# Patient Record
Sex: Female | Born: 1990 | Race: Asian | Hispanic: No | Marital: Married | Smoking: Never smoker
Health system: Southern US, Community
[De-identification: ages and names within clinical notes are randomized; demographics above are authoritative.]

## PROBLEM LIST (undated history)

## (undated) DIAGNOSIS — O0930 Supervision of pregnancy with insufficient antenatal care, unspecified trimester: Secondary | ICD-10-CM

## (undated) HISTORY — DX: Supervision of pregnancy with insufficient antenatal care, unspecified trimester: O09.30

## (undated) HISTORY — PX: NO PAST SURGERIES: SHX2092

---

## 2013-03-01 ENCOUNTER — Other Ambulatory Visit (HOSPITAL_COMMUNITY): Payer: Self-pay | Admitting: Physician Assistant

## 2013-03-01 DIAGNOSIS — Z0489 Encounter for examination and observation for other specified reasons: Secondary | ICD-10-CM

## 2013-03-01 LAB — OB RESULTS CONSOLE ANTIBODY SCREEN: Antibody Screen: POSITIVE

## 2013-03-01 LAB — OB RESULTS CONSOLE HEPATITIS B SURFACE ANTIGEN: Hepatitis B Surface Ag: NEGATIVE

## 2013-03-01 LAB — OB RESULTS CONSOLE RPR: RPR: NONREACTIVE

## 2013-03-01 LAB — OB RESULTS CONSOLE ABO/RH: RH Type: POSITIVE

## 2013-03-03 ENCOUNTER — Ambulatory Visit (HOSPITAL_COMMUNITY)
Admission: RE | Admit: 2013-03-03 | Discharge: 2013-03-03 | Disposition: A | Discharge status: Home / Self Care | Payer: Medicaid Other | Source: Ambulatory Visit | Attending: Physician Assistant | Admitting: Physician Assistant

## 2013-03-03 DIAGNOSIS — Z363 Encounter for antenatal screening for malformations: Secondary | ICD-10-CM | POA: Insufficient documentation

## 2013-03-03 DIAGNOSIS — Z0489 Encounter for examination and observation for other specified reasons: Secondary | ICD-10-CM

## 2013-03-03 DIAGNOSIS — O358XX0 Maternal care for other (suspected) fetal abnormality and damage, not applicable or unspecified: Secondary | ICD-10-CM | POA: Insufficient documentation

## 2013-03-03 DIAGNOSIS — Z1389 Encounter for screening for other disorder: Secondary | ICD-10-CM | POA: Insufficient documentation

## 2013-03-29 ENCOUNTER — Other Ambulatory Visit: Payer: Self-pay | Admitting: Nurse Practitioner

## 2013-03-29 DIAGNOSIS — O4412 Placenta previa with hemorrhage, second trimester: Secondary | ICD-10-CM

## 2013-03-29 DIAGNOSIS — IMO0002 Reserved for concepts with insufficient information to code with codable children: Secondary | ICD-10-CM

## 2013-04-26 ENCOUNTER — Ambulatory Visit (HOSPITAL_COMMUNITY)
Admission: RE | Admit: 2013-04-26 | Discharge: 2013-04-26 | Disposition: A | Discharge status: Home / Self Care | Payer: Medicaid Other | Source: Ambulatory Visit | Attending: Nurse Practitioner | Admitting: Nurse Practitioner

## 2013-04-26 DIAGNOSIS — O4412 Placenta previa with hemorrhage, second trimester: Secondary | ICD-10-CM

## 2013-04-26 DIAGNOSIS — IMO0002 Reserved for concepts with insufficient information to code with codable children: Secondary | ICD-10-CM

## 2013-04-26 DIAGNOSIS — Z3689 Encounter for other specified antenatal screening: Secondary | ICD-10-CM | POA: Insufficient documentation

## 2013-04-26 DIAGNOSIS — O4400 Placenta previa specified as without hemorrhage, unspecified trimester: Secondary | ICD-10-CM | POA: Insufficient documentation

## 2013-04-29 ENCOUNTER — Other Ambulatory Visit (HOSPITAL_COMMUNITY): Payer: Self-pay | Admitting: Physician Assistant

## 2013-04-29 DIAGNOSIS — O4403 Placenta previa specified as without hemorrhage, third trimester: Secondary | ICD-10-CM

## 2013-06-07 ENCOUNTER — Ambulatory Visit (HOSPITAL_COMMUNITY)
Admission: RE | Admit: 2013-06-07 | Discharge: 2013-06-07 | Disposition: A | Discharge status: Home / Self Care | Payer: Medicaid Other | Source: Ambulatory Visit | Attending: Physician Assistant | Admitting: Physician Assistant

## 2013-06-07 ENCOUNTER — Other Ambulatory Visit (HOSPITAL_COMMUNITY): Payer: Self-pay | Admitting: Physician Assistant

## 2013-06-07 ENCOUNTER — Encounter (HOSPITAL_COMMUNITY): Payer: Self-pay

## 2013-06-07 DIAGNOSIS — O4400 Placenta previa specified as without hemorrhage, unspecified trimester: Secondary | ICD-10-CM | POA: Insufficient documentation

## 2013-06-07 DIAGNOSIS — O4403 Placenta previa specified as without hemorrhage, third trimester: Secondary | ICD-10-CM

## 2013-06-07 DIAGNOSIS — O358XX0 Maternal care for other (suspected) fetal abnormality and damage, not applicable or unspecified: Secondary | ICD-10-CM | POA: Insufficient documentation

## 2013-06-23 LAB — OB RESULTS CONSOLE GBS: GBS: NEGATIVE

## 2013-07-19 ENCOUNTER — Other Ambulatory Visit (HOSPITAL_COMMUNITY): Payer: Self-pay | Admitting: Nurse Practitioner

## 2013-07-19 DIAGNOSIS — O480 Post-term pregnancy: Secondary | ICD-10-CM

## 2013-07-21 ENCOUNTER — Encounter (HOSPITAL_COMMUNITY): Payer: Self-pay | Admitting: *Deleted

## 2013-07-21 ENCOUNTER — Telehealth (HOSPITAL_COMMUNITY): Payer: Self-pay | Admitting: *Deleted

## 2013-07-21 NOTE — Telephone Encounter (Signed)
Preadmission screen Interpreter number [phone]

## 2013-07-21 NOTE — Telephone Encounter (Signed)
Preadmission screen Interpreter number [phone] states had some bleeding last night. Told to contact MD office today placed a conference call with clinic so pt could speak with them and the interpreter.  See clinic note for phone call conversation.

## 2013-07-21 NOTE — Telephone Encounter (Signed)
Health department notified of conversations with pt and stated if they needed to talk to her they should call [facility] and contact her due to language barrier.  Message left and on call RN paged by health dept

## 2013-07-22 ENCOUNTER — Ambulatory Visit (HOSPITAL_COMMUNITY): Admission: RE | Admit: 2013-07-22 | Payer: Medicaid Other | Source: Ambulatory Visit

## 2013-07-22 ENCOUNTER — Ambulatory Visit (HOSPITAL_COMMUNITY)
Admission: RE | Admit: 2013-07-22 | Discharge: 2013-07-22 | Disposition: A | Discharge status: Home / Self Care | Payer: Medicaid Other | Source: Ambulatory Visit | Attending: Nurse Practitioner | Admitting: Nurse Practitioner

## 2013-07-22 DIAGNOSIS — Z3689 Encounter for other specified antenatal screening: Secondary | ICD-10-CM | POA: Insufficient documentation

## 2013-07-22 DIAGNOSIS — IMO0001 Reserved for inherently not codable concepts without codable children: Secondary | ICD-10-CM | POA: Insufficient documentation

## 2013-07-22 DIAGNOSIS — O480 Post-term pregnancy: Secondary | ICD-10-CM | POA: Insufficient documentation

## 2013-07-27 ENCOUNTER — Encounter (HOSPITAL_COMMUNITY): Payer: Self-pay

## 2013-07-27 ENCOUNTER — Inpatient Hospital Stay (HOSPITAL_COMMUNITY)
Admission: RE | Admit: 2013-07-27 | Discharge: 2013-08-01 | DRG: 765 | Disposition: A | Discharge status: Home / Self Care | Payer: Medicaid Other | Source: Ambulatory Visit | Attending: Obstetrics and Gynecology | Admitting: Obstetrics and Gynecology

## 2013-07-27 DIAGNOSIS — O9903 Anemia complicating the puerperium: Secondary | ICD-10-CM | POA: Diagnosis not present

## 2013-07-27 DIAGNOSIS — IMO0002 Reserved for concepts with insufficient information to code with codable children: Secondary | ICD-10-CM | POA: Diagnosis present

## 2013-07-27 DIAGNOSIS — D649 Anemia, unspecified: Secondary | ICD-10-CM | POA: Diagnosis not present

## 2013-07-27 DIAGNOSIS — O324XX0 Maternal care for high head at term, not applicable or unspecified: Secondary | ICD-10-CM | POA: Diagnosis present

## 2013-07-27 DIAGNOSIS — O480 Post-term pregnancy: Principal | ICD-10-CM | POA: Diagnosis present

## 2013-07-27 LAB — COMPREHENSIVE METABOLIC PANEL
Albumin: 2.6 g/dL — ABNORMAL LOW (ref 3.5–5.2)
Alkaline Phosphatase: 318 U/L — ABNORMAL HIGH (ref 39–117)
BUN: 7 mg/dL (ref 6–23)
CO2: 23 mEq/L (ref 19–32)
Calcium: 9.5 mg/dL (ref 8.4–10.5)
Chloride: 104 mEq/L (ref 96–112)
GFR calc Af Amer: >90 mL/min (ref >90)
GFR calc non Af Amer: >90 mL/min (ref >90)
Glucose, Bld: 107 mg/dL — ABNORMAL HIGH (ref 70–99)
Potassium: 3.4 mEq/L — ABNORMAL LOW (ref 3.5–5.1)
Sodium: 137 mEq/L (ref 135–145)
Total Protein: 6.2 g/dL (ref 6.0–8.3)

## 2013-07-27 LAB — CBC
HCT: 30.9 % — ABNORMAL LOW (ref 36.0–46.0)
Hemoglobin: 10.8 g/dL — ABNORMAL LOW (ref 12.0–15.0)
MCH: 28.3 pg (ref 26.0–34.0)
MCHC: 35 g/dL (ref 30.0–36.0)
Platelets: 149 10*3/uL — ABNORMAL LOW (ref 150–400)
RDW: 14 % (ref 11.5–15.5)
WBC: 8.1 10*3/uL (ref 4.0–10.5)

## 2013-07-27 LAB — PROTEIN / CREATININE RATIO, URINE
Creatinine, Urine: 57.91 mg/dL
Protein Creatinine Ratio: 0.31 — ABNORMAL HIGH (ref 0.00–0.15)

## 2013-07-27 MED ORDER — LIDOCAINE HCL (PF) 1 % IJ SOLN
30.0000 mL | INTRAMUSCULAR | Status: DC | PRN
  Filled 2013-07-27: qty 30

## 2013-07-27 MED ORDER — LACTATED RINGERS IV SOLN
INTRAVENOUS | Status: DC
  Administered 2013-07-27 – 2013-07-29 (×9): via INTRAVENOUS

## 2013-07-27 MED ORDER — CITRIC ACID-SODIUM CITRATE 334-500 MG/5ML PO SOLN
30.0000 mL | ORAL | Status: DC | PRN
  Administered 2013-07-29: 30 mL via ORAL
  Filled 2013-07-27: qty 15

## 2013-07-27 MED ORDER — LACTATED RINGERS IV SOLN: 500.0000 mL | INTRAVENOUS | Status: DC | PRN

## 2013-07-27 MED ORDER — ONDANSETRON HCL 4 MG/2ML IJ SOLN
4.0000 mg | Freq: Four times a day (QID) | INTRAMUSCULAR | Status: DC | PRN
  Filled 2013-07-27: qty 2

## 2013-07-27 MED ORDER — FENTANYL CITRATE 0.05 MG/ML IJ SOLN
100.0000 ug | INTRAMUSCULAR | Status: DC | PRN
  Administered 2013-07-28: 100 ug via INTRAVENOUS
  Filled 2013-07-27: qty 2

## 2013-07-27 MED ORDER — OXYTOCIN BOLUS FROM INFUSION: 500.0000 mL | INTRAVENOUS | Status: DC

## 2013-07-27 MED ORDER — TERBUTALINE SULFATE 1 MG/ML IJ SOLN: 0.2500 mg | Freq: Once | INTRAMUSCULAR | Status: AC | PRN

## 2013-07-27 MED ORDER — OXYCODONE-ACETAMINOPHEN 5-325 MG PO TABS: 1.0000 | ORAL_TABLET | ORAL | Status: DC | PRN

## 2013-07-27 MED ORDER — OXYTOCIN 40 UNITS IN LACTATED RINGERS INFUSION - SIMPLE MED
1.0000 m[IU]/min | INTRAVENOUS | Status: DC
  Administered 2013-07-27: 1 m[IU]/min via INTRAVENOUS
  Administered 2013-07-28: 10 m[IU]/min via INTRAVENOUS
  Administered 2013-07-28: 9 m[IU]/min via INTRAVENOUS

## 2013-07-27 MED ORDER — ACETAMINOPHEN 325 MG PO TABS
650.0000 mg | ORAL_TABLET | ORAL | Status: DC | PRN
  Administered 2013-07-28: 650 mg via ORAL
  Filled 2013-07-27: qty 2

## 2013-07-27 MED ORDER — IBUPROFEN 600 MG PO TABS: 600.0000 mg | ORAL_TABLET | Freq: Four times a day (QID) | ORAL | Status: DC | PRN

## 2013-07-27 MED ORDER — OXYTOCIN 40 UNITS IN LACTATED RINGERS INFUSION - SIMPLE MED
62.5000 mL/h | INTRAVENOUS | Status: DC
  Filled 2013-07-27 (×2): qty 1000

## 2013-07-27 NOTE — Progress Notes (Signed)
Claudia Solis is a 22 y.o. G1P0 at [redacted]w[redacted]d by ultrasound admitted for induction of labor due to Post dates. Due date 07/17/13.  Subjective: Pt comfortable, denies cramping/contracitons.  Support person in room.  Burmese Nurse, learning disability used via Lawyer for all communication.  Objective: BP 138/82  Pulse 57  Temp(Src) 98.4 F (36.9 C) (Oral)  Resp 20  Ht 4\' 11"  (1.499 m)  Wt 70.308 kg (155 lb)  BMI 31.29 kg/m2  LMP 09/15/2012      FHT:  FHR: 130 bpm, variability: moderate,  accelerations:  Present,  decelerations:  Absent UC:   irritability SVE:   Dilation: 3 Effacement (%): 90 Station: -3 Exam by:: Courtney Paris CNM Vertex presentation  Labs: Lab Results  Component Value Date   WBC 8.1 07/27/2013   HGB 10.8* 07/27/2013   HCT 30.9* 07/27/2013   MCV 81.1 07/27/2013   PLT 149* 07/27/2013    Assessment / Plan: Induction of labor due to postterm  Labor: Start Pitocin at this time. Preeclampsia:  N/A, Preeclampsia labs ordered r/t BP 140s/90s on admission Fetal Wellbeing:  Category I Pain Control:  Labor support without medications I/D:  n/a Anticipated MOD:  NSVD  LEFTWICH-KIRBY, Claudia Solis 07/27/2013, 11:31 PM

## 2013-07-27 NOTE — H&P (Signed)
Claudia Solis is a 22 y.o. female G1P0 pt of HD @ [redacted]w[redacted]d presenting for IOL for postdates pregnancy.  This pregnancy was uncomplicated except for low-lying placenta, which resolved on U/S in 05/2013, and marginal cord insertion.  She reports good fetal movement, denies LOF, vaginal bleeding, vaginal itching/burning, urinary symptoms, h/a, dizziness, n/v, or fever/chills.     Maternal Medical History:  Reason for admission: Nausea.  Contractions: Frequency: rare.   Perceived severity is mild.    Fetal activity: Perceived fetal activity is normal.   Last perceived fetal movement was within the past hour.    Prenatal complications: no prenatal complications Prenatal Complications - Diabetes: none.    OB History   Grav Para Term Preterm Abortions TAB SAB Ect Mult Living   1              Past Medical History  Diagnosis Date  . Late prenatal care    Past Surgical History  Procedure Laterality Date  . No past surgeries     Family History: family history includes Dementia in her mother; Diabetes in her mother. Social History:  reports that she has never smoked. She has never used smokeless tobacco. She reports that she does not drink alcohol or use illicit drugs.   Prenatal Transfer Tool  Maternal Diabetes: No Genetic Screening: Declined Maternal Ultrasounds/Referrals: Normal Fetal Ultrasounds or other Referrals:  None Maternal Substance Abuse:  No Significant Maternal Medications:  None Significant Maternal Lab Results:  Lab values include: Group B Strep negative Other Comments:  None  Review of Systems  Constitutional: Negative for fever, chills and malaise/fatigue.  Eyes: Negative for blurred vision.  Respiratory: Negative for cough and shortness of breath.   Cardiovascular: Negative for chest pain.  Gastrointestinal: Negative for heartburn, nausea, vomiting and abdominal pain.  Genitourinary: Negative for dysuria, urgency and frequency.  Musculoskeletal: Negative.    Neurological: Negative for dizziness and headaches.  Psychiatric/Behavioral: Negative for depression.    Dilation: 3 Effacement (%): 90 Station: -3 Exam by:: Leftwich Kirby CNM Blood pressure 152/91, pulse 60, temperature 97.9 F (36.6 C), temperature source Oral, resp. rate 20, height 4\' 11"  (1.499 m), weight 70.308 kg (155 lb), last menstrual period 09/15/2012. Maternal Exam:  Uterine Assessment: Contraction strength is mild.  Contraction frequency is rare.   Abdomen: Fetal presentation: vertex  Cervix: Cervix evaluated by digital exam.     Fetal Exam Fetal Monitor Review: Mode: ultrasound.   Baseline rate: 135.  Variability: moderate (6-25 bpm).   Pattern: accelerations present and no decelerations.    Fetal State Assessment: Category I - tracings are normal.     Physical Exam  Nursing note and vitals reviewed. Constitutional: She is oriented to person, place, and time. She appears well-developed and well-nourished.  Neck: Normal range of motion.  Cardiovascular: Normal rate, regular rhythm and normal heart sounds.   Respiratory: Effort normal.  GI: Soft.  Musculoskeletal: Normal range of motion.  Neurological: She is alert and oriented to person, place, and time. She has normal reflexes.  Skin: Skin is warm and dry.  Psychiatric: She has a normal mood and affect. Her behavior is normal. Judgment and thought content normal.    Cervix 3/80/-3, anterior, soft, vertex  Prenatal labs: ABO, Rh: --/--/B POS (11/18 2105) Antibody: PENDING (11/18 2105) Rubella: Immune (06/23 0000) RPR: Nonreactive (06/23 0000)  HBsAg: Negative (06/23 0000)  HIV: Non-reactive (06/23 0000)  GBS: Negative (10/15 0000)  GTT: 1 hour 96  Assessment/Plan: Postdates pregnancy @[redacted]w[redacted]d  GBS negative  IOL for postdates Start Pitocin Anticipate NSVD    LEFTWICH-KIRBY, Rhyder Koegel 07/27/2013, 9:45 PM

## 2013-07-28 MED ORDER — OXYTOCIN 40 UNITS IN LACTATED RINGERS INFUSION - SIMPLE MED
1.0000 m[IU]/min | INTRAVENOUS | Status: DC
  Administered 2013-07-29: 4 m[IU]/min via INTRAVENOUS
  Administered 2013-07-29: 6 m[IU]/min via INTRAVENOUS
  Administered 2013-07-29: 8 m[IU]/min via INTRAVENOUS
  Administered 2013-07-29: 2 m[IU]/min via INTRAVENOUS
  Administered 2013-07-29: 12 m[IU]/min via INTRAVENOUS
  Administered 2013-07-29: 2 m[IU]/min via INTRAVENOUS

## 2013-07-28 MED ORDER — MISOPROSTOL 25 MCG QUARTER TABLET
25.0000 ug | ORAL_TABLET | Freq: Once | ORAL | Status: AC
  Administered 2013-07-28: 25 ug via VAGINAL
  Filled 2013-07-28: qty 0.25

## 2013-07-28 MED ORDER — MAGNESIUM SULFATE 40 G IN LACTATED RINGERS - SIMPLE
1.0000 g/h | INTRAVENOUS | Status: AC
  Administered 2013-07-29: 1 g/h via INTRAVENOUS
  Administered 2013-07-29: 2 g/h via INTRAVENOUS
  Filled 2013-07-28 (×3): qty 500

## 2013-07-28 MED ORDER — TERBUTALINE SULFATE 1 MG/ML IJ SOLN: 0.2500 mg | Freq: Once | INTRAMUSCULAR | Status: AC | PRN

## 2013-07-28 MED ORDER — MAGNESIUM SULFATE BOLUS VIA INFUSION
6.0000 g | Freq: Once | INTRAVENOUS | Status: AC
  Administered 2013-07-28: 6 g via INTRAVENOUS
  Filled 2013-07-28: qty 500

## 2013-07-28 NOTE — Progress Notes (Signed)
Claudia Solis is a 22 y.o. G1P0 at [redacted]w[redacted]d admitted for induction of labor due to Post dates.   Subjective: Pt comfortable with IV pain medication.    Objective: BP 146/95  Pulse 63  Temp(Src) 98.1 F (36.7 C) (Oral)  Resp 20  Ht 4\' 11"  (1.499 m)  Wt 70.308 kg (155 lb)  BMI 31.29 kg/m2  LMP 09/15/2012      FHT:  FHR: 130 bpm, variability: moderate,  accelerations:  Present,  decelerations:  Absent UC:   regular, every 3 minutes SVE:   Dilation: 3.5 Effacement (%): 80 Station: -3 Exam by:: [employer] RNC  Labs: Lab Results  Component Value Date   WBC 8.1 07/27/2013   HGB 10.8* 07/27/2013   HCT 30.9* 07/27/2013   MCV 81.1 07/27/2013   PLT 149* 07/27/2013    Assessment / Plan: Induction of labor due to postterm, Pitocin off r/t tacysystole   Labor: Pitocin to restart now Preeclampsia:  Start magnesium sulfate now  Fetal Wellbeing:  Category I Pain Control:  Fentanyl I/D:  n/a Anticipated MOD:  NSVD  LEFTWICH-KIRBY, Tanija Germani 07/28/2013, 7:29 AM

## 2013-07-28 NOTE — Progress Notes (Signed)
Claudia Solis is a 22 y.o. G1P0 at [redacted]w[redacted]d admitted for induction of labor due to postterm.   Subjective: Pt with controlled pain  Objective: BP 131/84  Pulse 91  Temp(Src) 97.5 F (36.4 C) (Oral)  Resp 20  Ht 4\' 11"  (1.499 m)  Wt 70.308 kg (155 lb)  BMI 31.29 kg/m2  LMP 09/15/2012   Total I/O In: 2314.6 [P.O.:300; I.V.:2014.6] Out: 1150 [Urine:1150]  Patellar DTRs normal, no clonus.  FHT:  FHR: 130 bpm, variability: moderate,  accelerations:  Present,  decelerations:  Absent UC:   irregular SVE:   Dilation: 3.5 Effacement (%): 80 Station: -3 Exam by:: [employer] RNC  Labs: Lab Results  Component Value Date   WBC 8.1 07/27/2013   HGB 10.8* 07/27/2013   HCT 30.9* 07/27/2013   MCV 81.1 07/27/2013   PLT 149* 07/27/2013    Assessment / Plan: Induction of labor due to postterm,  progressing well on pitocin  Labor: Progressing normally Preeclampsia:  on magnesium sulfate Fetal Wellbeing:  Category I Pain Control:  Fentanyl I/D:  n/a Anticipated MOD:  NSVD  Hazeline Junker 07/28/2013, 12:15 PM  I have seen and examined this patient and agree with above documentation in the resident's note.   Rulon Abide, M.D. [facility] Fellow 07/28/2013 12:25 PM

## 2013-07-28 NOTE — Progress Notes (Signed)
Claudia Solis is a 22 y.o. G1P0 at [redacted]w[redacted]d admitted for induction of labor due to Post dates.   Subjective: Pt requesting medication.  Language line used to discuss pain management options again, pt unsure if she desires epidural.  Offered IV medication to start, will reevaluate if pain not well managed.   Objective: BP 156/96  Pulse 57  Temp(Src) 98.2 F (36.8 C) (Oral)  Resp 20  Ht 4\' 11"  (1.499 m)  Wt 70.308 kg (155 lb)  BMI 31.29 kg/m2  LMP 09/15/2012      FHT:  FHR: 135 bpm, variability: moderate,  accelerations:  Present,  decelerations:  Absent UC:   irregular, every 1-8 minutes SVE:   Dilation: 3 Effacement (%): 90 Station: -3 Exam by:: leftwich kirby cnm BBOW  Labs: Lab Results  Component Value Date   WBC 8.1 07/27/2013   HGB 10.8* 07/27/2013   HCT 30.9* 07/27/2013   MCV 81.1 07/27/2013   PLT 149* 07/27/2013    Assessment / Plan: Induction of labor due to postterm,  progressing well on pitocin  Labor: Progressing normally Preeclampsia:  n/a Fetal Wellbeing:  Category I Pain Control:  Fentanyl I/D:  n/a Anticipated MOD:  NSVD  LEFTWICH-KIRBY, Claudia Solis 07/28/2013, 2:32 AM

## 2013-07-28 NOTE — Progress Notes (Signed)
Used language line to talk to patient about pain and needs. MD able to examine patient and discuss plan of care via interpreter. Pt has no questions or concerns at this time. Asked pt several times if she had a friend or family member in the community who could be present in the room to help her express her needs better. Pt told interpreter she did not. Explained pt would need a person in this room if she were to have any procedure ie epidural. Pt states she understood but did not have anyone available.

## 2013-07-28 NOTE — Progress Notes (Signed)
Claudia Solis is a 22 y.o. G1P0 at [redacted]w[redacted]d  admitted for IOL for postdates and with found pre-eclampsia at time of admission.   Subjective:   Pt having some mild pain but minimal. No LOF, VB. +FM.   Objective: BP 132/89  Pulse 91  Temp(Src) 97.5 F (36.4 C) (Oral)  Resp 20  Ht 4\' 11"  (1.499 m)  Wt 70.308 kg (155 lb)  BMI 31.29 kg/m2  LMP 09/15/2012   Total I/O In: 2549.3 [P.O.:400; I.V.:2149.3] Out: 1600 [Urine:1600]  FHT:  FHR: 120 bpm, variability: moderate,  accelerations:  Present,  decelerations:  Absent UC:   irregular, every 1-4 minutes SVE:   Dilation: 3.5 Effacement (%): 80 Station: -3 Exam by:: [employer] RNC  Labs: Lab Results  Component Value Date   WBC 8.1 07/27/2013   HGB 10.8* 07/27/2013   HCT 30.9* 07/27/2013   MCV 81.1 07/27/2013   PLT 149* 07/27/2013    Assessment / Plan: IOL for postdates- sluggish start to induction with pitocin given initally contractions too close together. now on 24mu/min but going up every hour  Labor: encouraged nursing to increase pitocin every 30 min instead of every hour and will attempt to base more on patient as well as she is comfortable. if still concern for tachysystole, consider AROM in the future for internals.  Preeclampsia:  on magnesium sulfate, no signs or symptoms of toxicity, intake and ouput balanced and labs stable Fetal Wellbeing:  Category I Pain Control:  Labor support without medications I/D:  n/a Anticipated MOD:  NSVD  Talbert Trembath L 07/28/2013, 1:42 PM

## 2013-07-28 NOTE — Progress Notes (Signed)
Claudia Solis is a 22 y.o. G1P0 at [redacted]w[redacted]d admitted for induction of labor due to Post dates.  Subjective:  Pt complaining of some lower abdominal cramping. +FM. No HA, vison changes, RUQ pain.   Objective: BP 128/83  Pulse 87  Temp(Src) 97.9 F (36.6 C) (Oral)  Resp 20  Ht 4\' 11"  (1.499 m)  Wt 70.308 kg (155 lb)  BMI 31.29 kg/m2  LMP 09/15/2012 I/O last 3 completed shifts: In: 3590 [P.O.:650; I.V.:2940] Out: 3700 [Urine:3700]    FHT:  FHR: 120 bpm, variability: moderate,  accelerations:  Present,  decelerations:  Absent UC:   Irregular occasional SVE:   Dilation: 3 Effacement (%): 90 Station: -3 Exam by:: Dr Reola Calkins  Labs: Lab Results  Component Value Date   WBC 8.1 07/27/2013   HGB 10.8* 07/27/2013   HCT 30.9* 07/27/2013   MCV 81.1 07/27/2013   PLT 149* 07/27/2013    Assessment / Plan: IOL for postdates, now with pitocin stopped and cytotec in place  Labor: pitocin stopped @4pm  and cytotec placed as pt with long period of time on low dose pit and with periods of tachysystole. Cervix, while effaced is still firm to touch. Will plan to restart pit in 1 hour - 4 hours post cytotec.  Preeclampsia:  on magnesium sulfate, no signs or symptoms of toxicity, intake and ouput balanced and labs stable Fetal Wellbeing:  Category I Pain Control:  Labor support without medications I/D:  n/a Anticipated MOD:  NSVD  Benett Swoyer L 07/28/2013, 7:32 PM

## 2013-07-28 NOTE — Progress Notes (Signed)
Claudia Solis is a 22 y.o. G1P0 at [redacted]w[redacted]d admitted for induction of labor due to Post dates.   Subjective: Pt breathing with contractions, using IV pain medication at this time.   Objective: BP 137/91  Pulse 85  Temp(Src) 98.1 F (36.7 C) (Oral)  Resp 20  Ht 4\' 11"  (1.499 m)  Wt 70.308 kg (155 lb)  BMI 31.29 kg/m2  LMP 09/15/2012      FHT:  FHR: 130 bpm, variability: moderate,  accelerations:  Present,  decelerations:  Absent UC:   regular, every 1 minutes SVE:   Dilation: 3 Effacement (%): 90 Station: -3 Exam by:: leftwich kirby cnm  Pitocin 6 milliunits/min  Labs: Lab Results  Component Value Date   WBC 8.1 07/27/2013   HGB 10.8* 07/27/2013   HCT 30.9* 07/27/2013   MCV 81.1 07/27/2013   PLT 149* 07/27/2013    Assessment / Plan: Induction of labor due to postterm Tachysystole on Pitocin  Labor: Pitocin off Preeclampsia:  n/a Fetal Wellbeing:  Category I Pain Control:  Fentanyl I/D:  n/a Anticipated MOD:  NSVD  LEFTWICH-KIRBY, Claudia Solis 07/28/2013, 5:44 AM

## 2013-07-29 ENCOUNTER — Encounter (HOSPITAL_COMMUNITY): Payer: Self-pay

## 2013-07-29 ENCOUNTER — Encounter (HOSPITAL_COMMUNITY): Payer: Medicaid Other | Admitting: Anesthesiology

## 2013-07-29 ENCOUNTER — Encounter (HOSPITAL_COMMUNITY)
Admission: RE | Disposition: A | Discharge status: Home / Self Care | Payer: Self-pay | Source: Ambulatory Visit | Attending: Obstetrics and Gynecology

## 2013-07-29 ENCOUNTER — Inpatient Hospital Stay (HOSPITAL_COMMUNITY): Payer: Medicaid Other | Admitting: Anesthesiology

## 2013-07-29 DIAGNOSIS — O480 Post-term pregnancy: Secondary | ICD-10-CM

## 2013-07-29 DIAGNOSIS — IMO0002 Reserved for concepts with insufficient information to code with codable children: Secondary | ICD-10-CM

## 2013-07-29 LAB — COMPREHENSIVE METABOLIC PANEL
AST: 29 U/L (ref 0–37)
Albumin: 2.7 g/dL — ABNORMAL LOW (ref 3.5–5.2)
Alkaline Phosphatase: 286 U/L — ABNORMAL HIGH (ref 39–117)
BUN: 7 mg/dL (ref 6–23)
Calcium: 7.3 mg/dL — ABNORMAL LOW (ref 8.4–10.5)
Chloride: 100 mEq/L (ref 96–112)
Glucose, Bld: 82 mg/dL (ref 70–99)
Potassium: 3.6 mEq/L (ref 3.5–5.1)
Sodium: 134 mEq/L — ABNORMAL LOW (ref 135–145)
Total Bilirubin: 0.3 mg/dL (ref 0.3–1.2)

## 2013-07-29 LAB — CBC
Hemoglobin: 11.3 g/dL — ABNORMAL LOW (ref 12.0–15.0)
MCH: 28.3 pg (ref 26.0–34.0)
MCHC: 33.6 g/dL (ref 30.0–36.0)
MCV: 81.5 fL (ref 78.0–100.0)
Platelets: 121 10*3/uL — ABNORMAL LOW (ref 150–400)
Platelets: 133 10*3/uL — ABNORMAL LOW (ref 150–400)
RBC: 4.01 MIL/uL (ref 3.87–5.11)
RDW: 14.1 % (ref 11.5–15.5)
RDW: 14.1 % (ref 11.5–15.5)
WBC: 8.9 10*3/uL (ref 4.0–10.5)

## 2013-07-29 LAB — MAGNESIUM: Magnesium: 5.8 mg/dL — ABNORMAL HIGH (ref 1.5–2.5)

## 2013-07-29 SURGERY — Surgical Case
Anesthesia: Epidural | Site: Abdomen | Wound class: Clean Contaminated

## 2013-07-29 MED ORDER — PHENYLEPHRINE 40 MCG/ML (10ML) SYRINGE FOR IV PUSH (FOR BLOOD PRESSURE SUPPORT): 80.0000 ug | PREFILLED_SYRINGE | INTRAVENOUS | Status: DC | PRN

## 2013-07-29 MED ORDER — GENTAMICIN SULFATE 40 MG/ML IJ SOLN
Freq: Three times a day (TID) | INTRAVENOUS | Status: DC
  Administered 2013-07-30 – 2013-08-01 (×6): via INTRAVENOUS
  Filled 2013-07-29 (×11): qty 3.25

## 2013-07-29 MED ORDER — DIPHENHYDRAMINE HCL 50 MG/ML IJ SOLN: 25.0000 mg | INTRAMUSCULAR | Status: DC | PRN

## 2013-07-29 MED ORDER — TETANUS-DIPHTH-ACELL PERTUSSIS 5-2.5-18.5 LF-MCG/0.5 IM SUSP
0.5000 mL | Freq: Once | INTRAMUSCULAR | Status: DC
  Filled 2013-07-29: qty 0.5

## 2013-07-29 MED ORDER — SCOPOLAMINE 1 MG/3DAYS TD PT72
MEDICATED_PATCH | TRANSDERMAL | Status: AC
  Filled 2013-07-29: qty 1

## 2013-07-29 MED ORDER — METOCLOPRAMIDE HCL 5 MG/ML IJ SOLN: 10.0000 mg | Freq: Three times a day (TID) | INTRAMUSCULAR | Status: DC | PRN

## 2013-07-29 MED ORDER — PHENYLEPHRINE HCL 10 MG/ML IJ SOLN
INTRAMUSCULAR | Status: DC | PRN
  Administered 2013-07-29: 80 ug via INTRAVENOUS
  Administered 2013-07-29: 40 ug via INTRAVENOUS
  Administered 2013-07-29: 80 ug via INTRAVENOUS

## 2013-07-29 MED ORDER — CLINDAMYCIN PHOSPHATE 900 MG/50ML IV SOLN
900.0000 mg | Freq: Once | INTRAVENOUS | Status: AC
  Administered 2013-07-29: 900 mg via INTRAVENOUS
  Filled 2013-07-29: qty 50

## 2013-07-29 MED ORDER — OXYTOCIN 10 UNIT/ML IJ SOLN
40.0000 [IU] | INTRAMUSCULAR | Status: DC | PRN
  Administered 2013-07-29: 40 [IU] via INTRAVENOUS

## 2013-07-29 MED ORDER — FENTANYL CITRATE 0.05 MG/ML IJ SOLN
INTRAMUSCULAR | Status: DC | PRN
  Administered 2013-07-29: 100 ug via INTRAVENOUS

## 2013-07-29 MED ORDER — DIPHENHYDRAMINE HCL 25 MG PO CAPS: 25.0000 mg | ORAL_CAPSULE | ORAL | Status: DC | PRN

## 2013-07-29 MED ORDER — LANOLIN HYDROUS EX OINT: 1.0000 "application " | TOPICAL_OINTMENT | CUTANEOUS | Status: DC | PRN

## 2013-07-29 MED ORDER — SODIUM BICARBONATE 8.4 % IV SOLN
INTRAVENOUS | Status: AC
  Filled 2013-07-29: qty 50

## 2013-07-29 MED ORDER — EPHEDRINE 5 MG/ML INJ
10.0000 mg | INTRAVENOUS | Status: DC | PRN
  Filled 2013-07-29: qty 4

## 2013-07-29 MED ORDER — MEPERIDINE HCL 25 MG/ML IJ SOLN
INTRAMUSCULAR | Status: AC
  Filled 2013-07-29: qty 1

## 2013-07-29 MED ORDER — FENTANYL CITRATE 0.05 MG/ML IJ SOLN: 25.0000 ug | INTRAMUSCULAR | Status: DC | PRN

## 2013-07-29 MED ORDER — OXYCODONE-ACETAMINOPHEN 5-325 MG PO TABS
1.0000 | ORAL_TABLET | ORAL | Status: DC | PRN
  Administered 2013-07-30: 1 via ORAL
  Administered 2013-07-30: 2 via ORAL
  Administered 2013-07-30: 1 via ORAL
  Administered 2013-07-30 – 2013-08-01 (×5): 2 via ORAL
  Filled 2013-07-29: qty 2
  Filled 2013-07-29: qty 1
  Filled 2013-07-29 (×3): qty 2
  Filled 2013-07-29: qty 1
  Filled 2013-07-29 (×3): qty 2

## 2013-07-29 MED ORDER — OXYTOCIN 10 UNIT/ML IJ SOLN
INTRAMUSCULAR | Status: AC
  Filled 2013-07-29: qty 4

## 2013-07-29 MED ORDER — MISOPROSTOL 100 MCG PO TABS
ORAL_TABLET | ORAL | Status: DC | PRN
  Administered 2013-07-29: 800 ug via RECTAL

## 2013-07-29 MED ORDER — GENTAMICIN SULFATE 40 MG/ML IJ SOLN
130.0000 mg | Freq: Three times a day (TID) | INTRAVENOUS | Status: DC
  Administered 2013-07-29: 130 mg via INTRAVENOUS
  Filled 2013-07-29 (×2): qty 3.25

## 2013-07-29 MED ORDER — PHENYLEPHRINE 40 MCG/ML (10ML) SYRINGE FOR IV PUSH (FOR BLOOD PRESSURE SUPPORT)
PREFILLED_SYRINGE | INTRAVENOUS | Status: AC
  Filled 2013-07-29: qty 5

## 2013-07-29 MED ORDER — FENTANYL 2.5 MCG/ML BUPIVACAINE 1/10 % EPIDURAL INFUSION (WH - ANES)
14.0000 mL/h | INTRAMUSCULAR | Status: DC | PRN
  Administered 2013-07-29: 14 mL/h via EPIDURAL
  Filled 2013-07-29 (×2): qty 125

## 2013-07-29 MED ORDER — 0.9 % SODIUM CHLORIDE (POUR BTL) OPTIME
TOPICAL | Status: DC | PRN
  Administered 2013-07-29: 1000 mL

## 2013-07-29 MED ORDER — SIMETHICONE 80 MG PO CHEW
80.0000 mg | CHEWABLE_TABLET | ORAL | Status: DC | PRN
  Administered 2013-07-30 – 2013-07-31 (×2): 80 mg via ORAL
  Filled 2013-07-29 (×2): qty 1

## 2013-07-29 MED ORDER — MEASLES, MUMPS & RUBELLA VAC ~~LOC~~ INJ
0.5000 mL | INJECTION | Freq: Once | SUBCUTANEOUS | Status: DC
  Filled 2013-07-29: qty 0.5

## 2013-07-29 MED ORDER — SCOPOLAMINE 1 MG/3DAYS TD PT72
1.0000 | MEDICATED_PATCH | Freq: Once | TRANSDERMAL | Status: DC
  Administered 2013-07-29: 1.5 mg via TRANSDERMAL

## 2013-07-29 MED ORDER — LIDOCAINE HCL (PF) 1 % IJ SOLN
INTRAMUSCULAR | Status: DC | PRN
  Administered 2013-07-29: 5 mL
  Administered 2013-07-29: 3 mL
  Administered 2013-07-29: 5 mL

## 2013-07-29 MED ORDER — LACTATED RINGERS IV SOLN
500.0000 mL | Freq: Once | INTRAVENOUS | Status: AC
  Administered 2013-07-29: 500 mL via INTRAVENOUS

## 2013-07-29 MED ORDER — WITCH HAZEL-GLYCERIN EX PADS: 1.0000 "application " | MEDICATED_PAD | CUTANEOUS | Status: DC | PRN

## 2013-07-29 MED ORDER — SENNOSIDES-DOCUSATE SODIUM 8.6-50 MG PO TABS
2.0000 | ORAL_TABLET | ORAL | Status: DC
  Administered 2013-07-29 – 2013-07-31 (×3): 2 via ORAL
  Filled 2013-07-29 (×3): qty 2

## 2013-07-29 MED ORDER — ONDANSETRON HCL 4 MG/2ML IJ SOLN: 4.0000 mg | INTRAMUSCULAR | Status: DC | PRN

## 2013-07-29 MED ORDER — PRENATAL MULTIVITAMIN CH
1.0000 | ORAL_TABLET | Freq: Every day | ORAL | Status: DC
  Administered 2013-07-30 – 2013-08-01 (×3): 1 via ORAL
  Filled 2013-07-29 (×3): qty 1

## 2013-07-29 MED ORDER — DIPHENHYDRAMINE HCL 50 MG/ML IJ SOLN: 12.5000 mg | INTRAMUSCULAR | Status: DC | PRN

## 2013-07-29 MED ORDER — MORPHINE SULFATE (PF) 0.5 MG/ML IJ SOLN
INTRAMUSCULAR | Status: DC | PRN
  Administered 2013-07-29: 3 mg via EPIDURAL

## 2013-07-29 MED ORDER — NALBUPHINE HCL 10 MG/ML IJ SOLN
5.0000 mg | INTRAMUSCULAR | Status: DC | PRN
  Filled 2013-07-29: qty 1

## 2013-07-29 MED ORDER — MISOPROSTOL 200 MCG PO TABS
ORAL_TABLET | ORAL | Status: AC
  Filled 2013-07-29: qty 4

## 2013-07-29 MED ORDER — SIMETHICONE 80 MG PO CHEW
80.0000 mg | CHEWABLE_TABLET | ORAL | Status: DC
  Administered 2013-07-29 – 2013-07-31 (×3): 80 mg via ORAL
  Filled 2013-07-29 (×3): qty 1

## 2013-07-29 MED ORDER — ONDANSETRON HCL 4 MG/2ML IJ SOLN
INTRAMUSCULAR | Status: DC | PRN
  Administered 2013-07-29: 4 mg via INTRAVENOUS

## 2013-07-29 MED ORDER — MORPHINE SULFATE 0.5 MG/ML IJ SOLN
INTRAMUSCULAR | Status: AC
  Filled 2013-07-29: qty 10

## 2013-07-29 MED ORDER — IBUPROFEN 600 MG PO TABS
600.0000 mg | ORAL_TABLET | Freq: Four times a day (QID) | ORAL | Status: DC
  Administered 2013-07-29 – 2013-08-01 (×11): 600 mg via ORAL
  Filled 2013-07-29 (×11): qty 1

## 2013-07-29 MED ORDER — LIDOCAINE-EPINEPHRINE (PF) 2 %-1:200000 IJ SOLN
INTRAMUSCULAR | Status: AC
  Filled 2013-07-29: qty 20

## 2013-07-29 MED ORDER — MENTHOL 3 MG MT LOZG: 1.0000 | LOZENGE | OROMUCOSAL | Status: DC | PRN

## 2013-07-29 MED ORDER — FENTANYL CITRATE 0.05 MG/ML IJ SOLN
INTRAMUSCULAR | Status: AC
  Filled 2013-07-29: qty 2

## 2013-07-29 MED ORDER — CEFAZOLIN SODIUM-DEXTROSE 2-3 GM-% IV SOLR
INTRAVENOUS | Status: DC | PRN
  Administered 2013-07-29: 2 g via INTRAVENOUS

## 2013-07-29 MED ORDER — DIBUCAINE 1 % RE OINT: 1.0000 "application " | TOPICAL_OINTMENT | RECTAL | Status: DC | PRN

## 2013-07-29 MED ORDER — LACTATED RINGERS IV SOLN
INTRAVENOUS | Status: DC
  Administered 2013-07-30 (×2): via INTRAVENOUS

## 2013-07-29 MED ORDER — OXYTOCIN 40 UNITS IN LACTATED RINGERS INFUSION - SIMPLE MED: 62.5000 mL/h | INTRAVENOUS | Status: AC

## 2013-07-29 MED ORDER — ONDANSETRON HCL 4 MG PO TABS: 4.0000 mg | ORAL_TABLET | ORAL | Status: DC | PRN

## 2013-07-29 MED ORDER — AMPICILLIN SODIUM 2 G IJ SOLR
2.0000 g | Freq: Four times a day (QID) | INTRAMUSCULAR | Status: DC
  Administered 2013-07-29: 2 g via INTRAVENOUS
  Filled 2013-07-29 (×2): qty 2000

## 2013-07-29 MED ORDER — MEPERIDINE HCL 25 MG/ML IJ SOLN
INTRAMUSCULAR | Status: DC | PRN
  Administered 2013-07-29 (×2): 12.5 mg via INTRAVENOUS

## 2013-07-29 MED ORDER — NALOXONE HCL 1 MG/ML IJ SOLN
1.0000 ug/kg/h | INTRAVENOUS | Status: DC | PRN
  Filled 2013-07-29: qty 2

## 2013-07-29 MED ORDER — PHENYLEPHRINE 40 MCG/ML (10ML) SYRINGE FOR IV PUSH (FOR BLOOD PRESSURE SUPPORT)
80.0000 ug | PREFILLED_SYRINGE | INTRAVENOUS | Status: DC | PRN
  Filled 2013-07-29: qty 10

## 2013-07-29 MED ORDER — EPHEDRINE 5 MG/ML INJ: 10.0000 mg | INTRAVENOUS | Status: DC | PRN

## 2013-07-29 MED ORDER — ONDANSETRON HCL 4 MG/2ML IJ SOLN: 4.0000 mg | Freq: Three times a day (TID) | INTRAMUSCULAR | Status: DC | PRN

## 2013-07-29 MED ORDER — CEFAZOLIN SODIUM-DEXTROSE 2-3 GM-% IV SOLR
INTRAVENOUS | Status: AC
  Filled 2013-07-29: qty 50

## 2013-07-29 MED ORDER — DIPHENHYDRAMINE HCL 25 MG PO CAPS: 25.0000 mg | ORAL_CAPSULE | Freq: Four times a day (QID) | ORAL | Status: DC | PRN

## 2013-07-29 MED ORDER — NALOXONE HCL 0.4 MG/ML IJ SOLN: 0.4000 mg | INTRAMUSCULAR | Status: DC | PRN

## 2013-07-29 MED ORDER — MEPERIDINE HCL 25 MG/ML IJ SOLN: 6.2500 mg | INTRAMUSCULAR | Status: DC | PRN

## 2013-07-29 MED ORDER — FENTANYL 2.5 MCG/ML BUPIVACAINE 1/10 % EPIDURAL INFUSION (WH - ANES)
INTRAMUSCULAR | Status: DC | PRN
  Administered 2013-07-29: 14 mL/h via EPIDURAL

## 2013-07-29 MED ORDER — ONDANSETRON HCL 4 MG/2ML IJ SOLN
INTRAMUSCULAR | Status: AC
  Filled 2013-07-29: qty 2

## 2013-07-29 MED ORDER — SODIUM BICARBONATE 8.4 % IV SOLN
INTRAVENOUS | Status: DC | PRN
  Administered 2013-07-29: 3 mL via EPIDURAL
  Administered 2013-07-29 (×2): 5 mL via EPIDURAL

## 2013-07-29 MED ORDER — SODIUM CHLORIDE 0.9 % IJ SOLN: 3.0000 mL | INTRAMUSCULAR | Status: DC | PRN

## 2013-07-29 SURGICAL SUPPLY — 33 items
BENZOIN TINCTURE PRP APPL 2/3 (GAUZE/BANDAGES/DRESSINGS) ×2 IMPLANT
CLAMP CORD UMBIL (MISCELLANEOUS) IMPLANT
CLOTH BEACON ORANGE TIMEOUT ST (SAFETY) ×2 IMPLANT
CONTAINER PREFILL 10% NBF 15ML (MISCELLANEOUS) IMPLANT
DRAIN JACKSON PRT FLT 7MM (DRAIN) IMPLANT
DRAPE LG THREE QUARTER DISP (DRAPES) IMPLANT
DRSG OPSITE POSTOP 4X10 (GAUZE/BANDAGES/DRESSINGS) ×2 IMPLANT
DURAPREP 26ML APPLICATOR (WOUND CARE) ×2 IMPLANT
ELECT REM PT RETURN 9FT ADLT (ELECTROSURGICAL) ×2
ELECTRODE REM PT RTRN 9FT ADLT (ELECTROSURGICAL) ×1 IMPLANT
EVACUATOR SILICONE 100CC (DRAIN) IMPLANT
EXTRACTOR VACUUM M CUP 4 TUBE (SUCTIONS) IMPLANT
GLOVE BIO SURGEON STRL SZ7 (GLOVE) ×2 IMPLANT
GLOVE BIOGEL PI IND STRL 7.0 (GLOVE) ×1 IMPLANT
GLOVE BIOGEL PI INDICATOR 7.0 (GLOVE) ×1
GOWN PREVENTION PLUS XLARGE (GOWN DISPOSABLE) ×4 IMPLANT
GOWN STRL REIN XL XLG (GOWN DISPOSABLE) ×4 IMPLANT
HEMOSTAT SURGICEL 2X3 (HEMOSTASIS) ×2 IMPLANT
KIT ABG SYR 3ML LUER SLIP (SYRINGE) ×2 IMPLANT
NEEDLE HYPO 25X5/8 SAFETYGLIDE (NEEDLE) ×2 IMPLANT
NS IRRIG 1000ML POUR BTL (IV SOLUTION) ×2 IMPLANT
PACK C SECTION WH (CUSTOM PROCEDURE TRAY) ×2 IMPLANT
PAD OB MATERNITY 4.3X12.25 (PERSONAL CARE ITEMS) ×2 IMPLANT
RTRCTR C-SECT PINK 25CM LRG (MISCELLANEOUS) ×2 IMPLANT
STAPLER VISISTAT 35W (STAPLE) IMPLANT
STRIP CLOSURE SKIN 1/2X4 (GAUZE/BANDAGES/DRESSINGS) ×2 IMPLANT
SUT MNCRL AB 0 CT1 27 (SUTURE) ×2 IMPLANT
SUT VIC AB 0 CTX 36 (SUTURE) ×4
SUT VIC AB 0 CTX36XBRD ANBCTRL (SUTURE) ×4 IMPLANT
SUT VIC AB 4-0 KS 27 (SUTURE) IMPLANT
TOWEL OR 17X24 6PK STRL BLUE (TOWEL DISPOSABLE) ×2 IMPLANT
TRAY FOLEY CATH 14FR (SET/KITS/TRAYS/PACK) IMPLANT
WATER STERILE IRR 1000ML POUR (IV SOLUTION) IMPLANT

## 2013-07-29 NOTE — Op Note (Addendum)
Walta Michael PROCEDURE DATE: 07/27/2013 - 07/29/2013  PREOPERATIVE DIAGNOSIS: Intrauterine pregnancy at  [redacted]w[redacted]d weeks gestation  POSTOPERATIVE DIAGNOSIS: The same  PROCEDURE:    Low Transverse Cesarean Section  SURGEON:  Dr. Elsie Lincoln  ASSISTANT: Dr. Bernie Covey  INDICATIONS: Claudia Solis is a 22 y.o. G1P1001 at [redacted]w[redacted]d with failure to descend and deep transverse arrest.  The risks of cesarean section discussed with the patient included but were not limited to: bleeding which may require transfusion or reoperation; infection which may require antibiotics; injury to bowel, bladder, ureters or other surrounding organs; injury to the fetus; need for additional procedures including hysterectomy in the event of a life-threatening hemorrhage; placental abnormalities wth subsequent pregnancies, incisional problems, thromboembolic phenomenon and other postoperative/anesthesia complications. The patient concurred with the proposed plan, giving informed written consent for the procedure.  Burmese Music therapist used.  FINDINGS:  Viable female  infant in cephalic presentation deep transverse arrest, Apgars 7, 8, weight to be determined in 1 hour, meconium stained amniotic fluid.  Intact placenta, three vessel cord.  Grossly normal ovaries and fallopian tubes.  Uterus had a hole in the posterior aspect of the uterus extending to the right broad ligament.  It was lower than the level of the incision on the front side of the uterus.  The area is worrisome for a uterine rupture. .   ANESTHESIA:    Epidural  ESTIMATED BLOOD LOSS: 800  SPECIMENS: Placenta sent to pathology  COMPLICATIONS: None immediate  PROCEDURE IN DETAIL:  The patient received intravenous antibiotics and had sequential compression devices applied to her lower extremities.  Epidural anesthesia was dosed up to surgical level and was found to be adequate. She was then placed in a dorsal supine position with a leftward tilt, and prepped and draped in  a sterile manner.  A foley catheter was already in her bladder and attached to constant gravity.  After an adequate timeout was performed, a Pfannenstiel skin incision was made with scalpel and carried through to the underlying layer of fascia. The fascia was incised in the midline and this incision was extended bilaterally using the Mayo scissors. Kocher clamps were applied to the superior aspect of the fascial incision and the underlying rectus muscles were dissected off bluntly. A similar process was carried out on the inferior aspect of the facial incision. The rectus muscles were separated in the midline bluntly and the peritoneum was entered bluntly.   Access to the pelvis was limited still so the rectus muscles were transected partially.  A transverse hysterotomy was made with a scalpel and extended bilaterally bluntly. The bladder blade was then removed. The infant was successfully delivered, and cord was clamped and cut and infant was handed over to awaiting neonatology team. Uterine massage was then administered and the placenta delivered intact with three-vessel cord. The uterus was cleared of clot and debris.  The hysterotomy was closed with 0 vicryl.  A second imbricating suture of 0-Vicryl was used to reinforce the incision and aid in hemostasis.  The posterior aspect of the uterus where the suspected rupture occurred was hemostatic.  There was a small hematoma which was observed and noted to not be expanding.  The peritoneum and rectus muscles were noted to be hemostatic.  The rectus muscles were reapproximated with 0-Vicryl.  The fascia was closed with 0-Vicryl in a running fashion with good restoration of anatomy.  The subcutaneus tissue was copiously irrigated.  The skin was closed with 4-0 Vicryl in a subcuticular fashion.  Pt tolerated the procedure will.  All counts were correct x2.  Pt went to the recovery room in stable condition.

## 2013-07-29 NOTE — Anesthesia Procedure Notes (Signed)
Epidural Patient location during procedure: OB  Staffing Anesthesiologist: Phillips Grout Performed by: anesthesiologist   Preanesthetic Checklist Completed: patient identified, site marked, surgical consent, pre-op evaluation, timeout performed, IV checked, risks and benefits discussed and monitors and equipment checked  Epidural Patient position: sitting Prep: ChloraPrep Patient monitoring: heart rate, continuous pulse ox and blood pressure Approach: right paramedian Injection technique: LOR saline  Needle:  Needle type: Tuohy  Needle gauge: 17 G Needle length: 9 cm and 9 Needle insertion depth: 4 cm Catheter type: closed end flexible Catheter size: 20 Guage Catheter at skin depth: 9 cm Test dose: negative  Assessment Events: blood not aspirated, injection not painful, no injection resistance, negative IV test and no paresthesia  Additional Notes   Patient tolerated the insertion well without complications.

## 2013-07-29 NOTE — Consult Note (Signed)
Neonatology Note:   Attendance at C-section:    I was asked by Dr. Penne Lash to attend this primary C/S at 41 5/7 weeks due to failure of descent. The mother is a G1P0 B pos, GBS neg with late PNC,  marginal cord insertion and onset of fever to 101.6 degrees. She has been on Magnesium sulfate for 36 hours due to [facility]. Time of ROM unknown per OB RN; thought to be intact, but could not feel membranes on exam today and there was very little fluid at delivery, noted to be meconium-stained fluid by Dr. Penne Lash. She received one dose of Ampicillin and Gentamicin about 1 hour before delivery.  Infant was floppy but with spontaneous respirations and normal HR at birth. We noted no meconium-staining of his skin, cord, and the secretions we bulb suctioned from his nares and throat were clear mucous. He pinked up quickly, but continued to have decreased tone, probably due to maternal magnesium. He appeared alert and had no resp distress. His head was markedly molded.  Ap 7/8. Lungs clear to ausc in DR. To CN to care of Pediatrician.   Doretha Sou, MD

## 2013-07-29 NOTE — Progress Notes (Signed)
Patient ID: Claudia Solis, female   DOB: [birth year], 22 y.o.   MRN: [id number] Claudia Solis is a 22 y.o. G1P0 at [redacted]w[redacted]d admitted for IOL for postdates and now preE on MgSO4  Subjective: Comfortable, dozing  Objective: BP 129/93  Pulse 89  Temp(Src) 98.7 F (37.1 C) (Oral)  Resp 16  Ht 4\' 11"  (1.499 m)  Wt 70.308 kg (155 lb)  BMI 31.29 kg/m2  SpO2 98%  LMP 09/15/2012 Filed Vitals:   07/29/13 0801 07/29/13 0830 07/29/13 0900 07/29/13 0930  BP:  133/92 128/90 129/93  Pulse: 87 85 84 89  Temp:      TempSrc:      Resp:   16   Height:      Weight:      SpO2: 98%      Results for orders placed during the hospital encounter of 07/27/13 (from the past 24 hour(s))  CBC     Status: Abnormal   Collection Time    07/29/13  5:10 AM      Result Value Range   WBC 8.9  4.0 - 10.5 K/uL   RBC 4.01  3.87 - 5.11 MIL/uL   Hemoglobin 11.3 (*) 12.0 - 15.0 g/dL   HCT 63.8 (*) 75.6 - 43.3 %   MCV 81.5  78.0 - 100.0 fL   MCH 28.2  26.0 - 34.0 pg   MCHC 34.6  30.0 - 36.0 g/dL   RDW 29.5  18.8 - 41.6 %   Platelets 121 (*) 150 - 400 K/uL   CMP WNL UOP 50-70/hr  Fetal Heart FHR: 110-115 bpm, variability: moderate,  accelerations:  Present,  decelerations:  Absent  Ext: 2-3 + pitting edema, DTRs 1+  Contractions: q3, on MgSO4 2 Gm/hr and pitocin  SVE:   Dilation: 5 Effacement (%): 100 Station: -2 Exam by:: Ioane Bhola, D Bloody show, caput, did not feel membranes  Assessment / Plan: PreE parameters stable Labor: active phase progressive Fetal Wellbeing: Cat 1 Pain Control:  adequate Expected mode of delivery: NSVD  Claudia Solis 07/29/2013, 9:58 AM

## 2013-07-29 NOTE — Transfer of Care (Signed)
Immediate Anesthesia Transfer of Care Note  Patient: Claudia Solis  Procedure(s) Performed: Procedure(s): CESAREAN SECTION (N/A)  Patient Location: PACU  Anesthesia Type:Epidural  Level of Consciousness: awake, alert , oriented and patient cooperative  Airway & Oxygen Therapy: Patient Spontanous Breathing  Post-op Assessment: Report given to PACU RN, Post -op Vital signs reviewed and stable and Patient moving all extremities  Post vital signs: Reviewed and stable  Complications: No apparent anesthesia complications

## 2013-07-29 NOTE — Anesthesia Postprocedure Evaluation (Signed)
Anesthesia Post Note  Patient: Claudia Solis  Procedure(s) Performed: Procedure(s) (LRB): CESAREAN SECTION (N/A)  Anesthesia type: Epidural  Patient location: PACU  Post pain: Pain level controlled  Post assessment: Post-op Vital signs reviewed  Last Vitals:  Filed Vitals:   07/29/13 2130  BP: 124/83  Pulse: 102  Temp: 38.7 C  Resp: 24    Post vital signs: Reviewed  Level of consciousness: awake  Complications: No apparent anesthesia complications

## 2013-07-29 NOTE — Progress Notes (Signed)
ANTIBIOTIC CONSULT NOTE - INITIAL  Pharmacy Consult for Gentamicin Indication: R/O chorioamnionitis; Maternal temp  No Known Allergies  Patient Measurements: Height: 4\' 11"  (149.9 cm) Weight: 155 lb (70.308 kg) IBW/kg (Calculated) : 43.2 Adjusted Body Weight: 51.3kg  Vital Signs: Temp: 106.5 F (41.4 C) (11/20 1810) Temp src: Axillary (11/20 1810) BP: 151/93 mmHg (11/20 1800) Pulse Rate: 105 (11/20 1800)   Labs:  Recent Labs  07/27/13 2105 07/27/13 2150 07/29/13 0510 07/29/13 1020  WBC 8.1  --  8.9  --   HGB 10.8*  --  11.3*  --   PLT 149*  --  121*  --   LABCREA  --  57.91  --   --   CREATININE 0.50  --   --  0.60   Estimated Creatinine Clearance: 94.8 ml/min (by C-G formula based on Cr of 0.6).   Microbiology: No results found for this or any previous visit (from the past 720 hour(s)).  Medical History: Past Medical History  Diagnosis Date  . Late prenatal care     Medications:  Ampicillin 2 gram IV q6h Assessment: 22yo F admitted for IOL due to postdate. Pt now has developed maternal temp during labor--antibiotics started to r/o chorioamnionitis.   Goal of Therapy:  Gentamicin peak 6-42mcg/ml and trough < 91mcg/ml  Plan:  1. Gentamicin 130mg  IV q8h. 2. Will continue to follow and assess need for Gentamicin levels if continued postpartum > 72 hours. Thanks!  Claybon Jabs 07/29/2013,6:23 PM

## 2013-07-29 NOTE — Progress Notes (Signed)
Patient ID: Claudia Solis, female   DOB: 1991/07/06, 22 y.o.   MRN: [id number] Claudia Solis is a 22 y.o. G1P0 at [redacted]w[redacted]d admitted forIOL  Subjective: Comfortable.  Objective: BP 130/90  Pulse 90  Temp(Src) 98.5 F (36.9 C) (Oral)  Resp 20  Ht 4\' 11"  (1.499 m)  Wt 70.308 kg (155 lb)  BMI 31.29 kg/m2  SpO2 98%  LMP 09/15/2012  Fetal Heart FHR: 105 bpm, variability: moderate,  accelerations:  Abscent,  decelerations:  Absent  Neg CST Contractions: pitocin decreased from 12 mu to 6 mu d/t frequent UCs, now q1-2.5 min  SVE:   Dilation: 9 Effacement (%): 100 Station: -1 Exam by:: Deidra Roxanne Orner  Assessment / Plan:  Labor: progressive Fetal Wellbeing: cat 2 Pain Control:  adequate Expected mode of delivery: NSVD  Esvin Hnat 07/29/2013, 12:19 PM

## 2013-07-29 NOTE — Progress Notes (Signed)
Patient ID: Claudia Solis, female   DOB: 1990/12/19, 22 y.o.   MRN: [id number] Claudia Solis is a 22 y.o. G1P0 at [redacted]w[redacted]d admitted for IOL  Subjective: Pitocin was turned off and pt resting/sleeping after she had pushed about 30 min. O2 mask @ 2 l  Objective: BP 137/92  Pulse 94  Temp(Src) 99.5 F (37.5 C) (Oral)  Resp 22  Ht 4\' 11"  (1.499 m)  Wt 70.308 kg (155 lb)  BMI 31.29 kg/m2  SpO2 99%  LMP 09/15/2012 Filed Vitals:   07/29/13 1446 07/29/13 1456 07/29/13 1500 07/29/13 1530  BP:  129/81 131/81 137/92  Pulse:  91 90 94  Temp: 99.5 F (37.5 C)     TempSrc: Oral     Resp:      Height:      Weight:      SpO2:      Mag at 1 gm/hr Fetal Heart FHR: 100 bpm, variability: moderate,  accelerations:  Abscent,  decelerations:  Absent  FSS minimal accel (5 sec) Contractions: irreg about  UOP: 1-3:30pm 75, since then 25cc SVE:   Dilation: Lip/rim Effacement (%): 100 Station: +2 Exam by:: Claudia Solis, CNM VE: C/C/+1, watery bloody show, IFSE placed earlier. IUPC placed and pitocin restarted Assessment / Plan: Preeclampsia: stable BPs, but borderline low UOP  Labor: Protracted second stage (passive)> restart pitocin and titrate to contractions Fetal Wellbeing: Cat 2, bradycardia Pain Control:  adequate Expected mode of delivery: NSVD  C/W Dr. Terance Hart, CNM 07/29/2013 3:47 PM    Claudia Solis 07/29/2013, 3:36 PM

## 2013-07-29 NOTE — Progress Notes (Signed)
D. Poe, cnm  Notified of fetal heartrate tracing.  Baseline 90-100's.  Pt in left exaggerated sims with O2 on.  Poe, cnm will call attending to evaluate fhr strip and pt ve.

## 2013-07-29 NOTE — Progress Notes (Signed)
Patient ID: Claudia Solis, female   DOB: 14-Jan-1991, 22 y.o.   MRN: [id number] Claudia Solis is a 22 y.o. G1P0 at [redacted]w[redacted]d admitted for IOL  Subjective: Urge to push and involuntary pushing  Objective: BP 137/94  Pulse 96  Temp(Src) 98.5 F (36.9 C) (Oral)  Resp 16  Ht 4\' 11"  (1.499 m)  Wt 70.308 kg (155 lb)  BMI 31.29 kg/m2  SpO2 99%  LMP 09/15/2012 Results for orders placed during the hospital encounter of 07/27/13 (from the past 24 hour(s))  CBC     Status: Abnormal   Collection Time    07/29/13  5:10 AM      Result Value Range   WBC 8.9  4.0 - 10.5 K/uL   RBC 4.01  3.87 - 5.11 MIL/uL   Hemoglobin 11.3 (*) 12.0 - 15.0 g/dL   HCT 40.9 (*) 81.1 - 91.4 %   MCV 81.5  78.0 - 100.0 fL   MCH 28.2  26.0 - 34.0 pg   MCHC 34.6  30.0 - 36.0 g/dL   RDW 78.2  95.6 - 21.3 %   Platelets 121 (*) 150 - 400 K/uL  MAGNESIUM     Status: Abnormal   Collection Time    07/29/13 10:17 AM      Result Value Range   Magnesium 5.8 (*) 1.5 - 2.5 mg/dL  COMPREHENSIVE METABOLIC PANEL     Status: Abnormal   Collection Time    07/29/13 10:20 AM      Result Value Range   Sodium 134 (*) 135 - 145 mEq/L   Potassium 3.6  3.5 - 5.1 mEq/L   Chloride 100  96 - 112 mEq/L   CO2 20  19 - 32 mEq/L   Glucose, Bld 82  70 - 99 mg/dL   BUN 7  6 - 23 mg/dL   Creatinine, Ser 0.86  0.50 - 1.10 mg/dL   Calcium 7.3 (*) 8.4 - 10.5 mg/dL   Total Protein 6.7  6.0 - 8.3 g/dL   Albumin 2.7 (*) 3.5 - 5.2 g/dL   AST 29  0 - 37 U/L   ALT 7  0 - 35 U/L   Alkaline Phosphatase 286 (*) 39 - 117 U/L   Total Bilirubin 0.3  0.3 - 1.2 mg/dL   GFR calc non Af Amer >90  >90 mL/min   GFR calc Af Amer >90  >90 mL/min   Fetal Heart FHR: 100 bpm, variability: moderate,  accelerations:  Abscent,  decelerations:  Absent   Contractions: q 2, pitocin at 6 mu MgSO4 at 1 gm/h  SVE:   Dilation: Lip/rim Effacement (%): 100 Station: +1 Exam by:: Deidra Poe Station +1, elevated ant lip manually>+2; IFSE placed +FSS and baseline105-110 Assessment  / Plan: Pre E stable Labor: progressive 2nd stage Fetal Wellbeing: cat 2 Pain Control:  adequate Expected mode of delivery: NSVD [place] interpreter> advised to push with urge  POE,DEIRDRE 07/29/2013, 2:21 PM

## 2013-07-29 NOTE — Anesthesia Preprocedure Evaluation (Addendum)
Anesthesia Evaluation  Patient identified by MRN, date of birth, ID band Patient awake    Reviewed: Allergy & Precautions, H&P , NPO status , Patient's Chart, lab work & pertinent test results  History of Anesthesia Complications Negative for: history of anesthetic complications  Airway Mallampati: II TM Distance: >3 FB Neck ROM: full    Dental no notable dental hx. (+) Teeth Intact   Pulmonary neg pulmonary ROS,  breath sounds clear to auscultation  Pulmonary exam normal       Cardiovascular hypertension, negative cardio ROS  Rhythm:regular Rate:Normal     Neuro/Psych negative neurological ROS  negative psych ROS   GI/Hepatic negative GI ROS, Neg liver ROS,   Endo/Other  negative endocrine ROS  Renal/GU negative Renal ROS  negative genitourinary   Musculoskeletal   Abdominal Normal abdominal exam  (+)   Peds  Hematology negative hematology ROS (+)   Anesthesia Other Findings   Reproductive/Obstetrics (+) Pregnancy                           Anesthesia Physical Anesthesia Plan  ASA: II and emergent  Anesthesia Plan: Epidural   Post-op Pain Management:    Induction:   Airway Management Planned:   Additional Equipment:   Intra-op Plan:   Post-operative Plan:   Informed Consent: I have reviewed the patients History and Physical, chart, labs and discussed the procedure including the risks, benefits and alternatives for the proposed anesthesia with the patient or authorized representative who has indicated his/her understanding and acceptance.     Plan Discussed with: Anesthesiologist, CRNA and Surgeon  Anesthesia Plan Comments: (C/Section for arrest of descent and failure to progress. Use epidural for C/Section)       Anesthesia Quick Evaluation

## 2013-07-29 NOTE — Progress Notes (Signed)
Claudia Solis is a 22 y.o. G1P0 at [redacted]w[redacted]d with preeclampsia and ostdates Subjective:   Objective: BP 151/93  Pulse 105  Temp(Src) 106.5 F (41.4 C) (Axillary)  Resp 22  Ht 4\' 11"  (1.499 m)  Wt 155 lb (70.308 kg)  BMI 31.29 kg/m2  SpO2 100%  LMP 09/15/2012 I/O last 3 completed shifts: In: 5785 [P.O.:880; I.V.:4905] Out: 5575 [Urine:5575] Total I/O In: 1761.3 [P.O.:380; I.V.:1381.3] Out: 545 [Urine:395; Emesis/NG output:150]  FHT:  FHR: 120 bpm, variability: moderate,  accelerations:  Present,  decelerations:  Absent UC:   regular, every 3-4 minutes Complete/head at 0 (caput at +1)  Labs: Lab Results  Component Value Date   WBC 8.9 07/29/2013   HGB 11.3* 07/29/2013   HCT 32.7* 07/29/2013   MCV 81.5 07/29/2013   PLT 121* 07/29/2013    Assessment / Plan: Protracted active phase  Labor: arrest of descent  The risks of cesarean section discussed with the patient included but were not limited to: bleeding which may require transfusion or reoperation; infection which may require antibiotics; injury to bowel, bladder, ureters or other surrounding organs; injury to the fetus; need for additional procedures including hysterectomy in the event of a life-threatening hemorrhage; placental abnormalities wth subsequent pregnancies, incisional problems, thromboembolic phenomenon and other postoperative/anesthesia complications. The patient concurred with the proposed plan, giving informed written consent for the procedure.     Giovanni Bath H. 07/29/2013, 6:23 PM

## 2013-07-29 NOTE — Progress Notes (Signed)
Talked with patient using the interpreter line.  She has no concerns or questions at this time.  Her progress was discussed and she stated understanding of information given.

## 2013-07-29 NOTE — Progress Notes (Signed)
Patient ID: Claudia Solis, female   DOB: 07-Oct-1990, 22 y.o.   MRN: [id number] UOP 25cc last hr. FHR baseline 100-105, DTR 0-1+ Will check Mg++ level.

## 2013-07-30 ENCOUNTER — Encounter (HOSPITAL_COMMUNITY): Payer: Self-pay | Admitting: Obstetrics & Gynecology

## 2013-07-30 LAB — CBC
MCH: 28.4 pg (ref 26.0–34.0)
MCHC: 34.8 g/dL (ref 30.0–36.0)
Platelets: 108 10*3/uL — ABNORMAL LOW (ref 150–400)
RBC: 2.75 MIL/uL — ABNORMAL LOW (ref 3.87–5.11)
RDW: 14.1 % (ref 11.5–15.5)

## 2013-07-30 MED ORDER — ZOLPIDEM TARTRATE 5 MG PO TABS
5.0000 mg | ORAL_TABLET | Freq: Every evening | ORAL | Status: DC | PRN
  Administered 2013-07-30: 5 mg via ORAL
  Filled 2013-07-30: qty 1

## 2013-07-30 NOTE — Progress Notes (Signed)
Discussed plan of care, asked questions of pt for shift assessment and answered questions from patient and husband through [employer] from 1955 - 2008.

## 2013-07-30 NOTE — Plan of Care (Addendum)
Progress note Goal: Postpartum Patient Education (See Patient Education module for education specifics.)  Outcome: Completed/Met Date Met:  07/30/13 Bur[employer]ese Equ[employer] trader used

## 2013-07-30 NOTE — Progress Notes (Signed)
Introduction to unit, mother baby teacing, instructions and Q&A all done with interpreter.

## 2013-07-30 NOTE — Progress Notes (Signed)
UR chart review completed.  

## 2013-07-30 NOTE — Progress Notes (Signed)
Subjective: Postpartum Day 1: Cesarean Delivery Patient reports incisional pain appropriate for post-op pain. Tolerating po. Foley in place.     Objective: Vital signs in last 24 hours: Temp:  [98.2 F (36.8 C)-106.5 F (41.4 C)] 98.2 F (36.8 C) (11/21 0800) Pulse Rate:  [86-112] 89 (11/21 1000) Resp:  [16-28] 20 (11/21 0800) BP: (102-152)/(64-131) 114/73 mmHg (11/21 1000) SpO2:  [94 %-100 %] 95 % (11/21 1000) Weight:  [71.759 kg (158 lb 3.2 oz)-72.485 kg (159 lb 12.8 oz)] 72.485 kg (159 lb 12.8 oz) (11/21 0600)  Physical Exam:  General: alert, cooperative and no distress Lochia: appropriate Uterine Fundus: firm Incision: healing well, clean and dry with honeycomb in place DVT Evaluation: No evidence of DVT seen on physical exam. DTR: 3+ with 1 beat of clonus.    Recent Labs  07/29/13 2115 07/30/13 0505  HGB 9.5* 7.8*  HCT 28.3* 22.4*    Assessment/Plan: Status post Cesarean section. Doing well postoperatively.  Continue current care.  1) pre-E - now starting to diurese with 450 cc in the last 3 hours - will monitor for now - cont mag until 9pm tonight - BP normal   2) baby in NICU - mom to go see today She is breast feeding    Claudia Solis L 07/30/2013, 10:44 AM

## 2013-07-30 NOTE — Anesthesia Postprocedure Evaluation (Signed)
  Anesthesia Post-op Note  Patient: Claudia Solis  Procedure(s) Performed: Procedure(s): CESAREAN SECTION (N/A)  Patient Location: A-ICU  Anesthesia Type:Epidural  Level of Consciousness: awake, alert , oriented and patient cooperative Language line used to interview pt because of language barrier.    Airway and Oxygen Therapy: Patient Spontanous Breathing  Post-op Pain: mild  Post-op Assessment: Patient's Cardiovascular Status Stable, Respiratory Function Stable, No headache, No backache, No residual numbness and No residual motor weakness  Post-op Vital Signs: stable  Complications: No apparent anesthesia complications

## 2013-07-31 LAB — TYPE AND SCREEN
ABO/RH(D): B POS
Antibody Screen: POSITIVE
DAT, IgG: NEGATIVE
Unit division: 0

## 2013-07-31 LAB — CBC
Platelets: 130 10*3/uL — ABNORMAL LOW (ref 150–400)
RDW: 14.9 % (ref 11.5–15.5)
WBC: 11 10*3/uL — ABNORMAL HIGH (ref 4.0–10.5)

## 2013-07-31 LAB — PREPARE RBC (CROSSMATCH)

## 2013-07-31 MED ORDER — FUROSEMIDE 10 MG/ML IJ SOLN
40.0000 mg | Freq: Once | INTRAMUSCULAR | Status: AC
  Administered 2013-07-31: 40 mg via INTRAVENOUS
  Filled 2013-07-31: qty 4

## 2013-07-31 MED ORDER — DIPHENHYDRAMINE HCL 25 MG PO CAPS
25.0000 mg | ORAL_CAPSULE | Freq: Once | ORAL | Status: AC
  Administered 2013-07-31: 25 mg via ORAL
  Filled 2013-07-31: qty 1

## 2013-07-31 MED ORDER — ACETAMINOPHEN 325 MG PO TABS
650.0000 mg | ORAL_TABLET | Freq: Once | ORAL | Status: AC
  Administered 2013-07-31: 650 mg via ORAL
  Filled 2013-07-31: qty 2

## 2013-07-31 NOTE — Progress Notes (Signed)
Subjective: Postpartum Day 1: Cesarean Delivery Patient reports incisional pain and tolerating PO.   Pt reports increased pain yesterday; improved; no dizziness, just feels weak  Objective: Vital signs in last 24 hours: Temp:  [97.6 F (36.4 C)-98.4 F (36.9 C)] 98.4 F (36.9 C) (11/22 0340) Pulse Rate:  [81-100] 95 (11/22 0700) Resp:  [16-28] 20 (11/22 0515) BP: (101-132)/(65-97) 127/97 mmHg (11/22 0700) SpO2:  [94 %-99 %] 98 % (11/22 0700)  Physical Exam:  General: alert and no distress Lochia: appropriate Uterine Fundus: firm Incision: dressing dry DVT Evaluation: No evidence of DVT seen on physical exam. LEL edema   Recent Labs  07/29/13 2115 07/30/13 0505  HGB 9.5* 7.8*  HCT 28.3* 22.4*    Assessment/Plan: Status post Cesarean section. Postoperative course complicated by anemia and pain  Continue current care Transfuse 2 units of PRBCs Lasix 40mg  IV x 1 .  HARRAWAY-SMITH, Phillippa Straub 07/31/2013, 8:09 AM

## 2013-07-31 NOTE — Progress Notes (Signed)
Update regarding baby provided with the help of [facility] Interpreter [phone].

## 2013-08-01 LAB — TYPE AND SCREEN
ABO/RH(D): B POS
Antibody Screen: NEGATIVE
Unit division: 0
Unit division: 0
Unit division: 0

## 2013-08-01 MED ORDER — FERROUS GLUCONATE 324 (38 FE) MG PO TABS: 324.0000 mg | ORAL_TABLET | Freq: Two times a day (BID) | ORAL | Status: AC

## 2013-08-01 MED ORDER — IBUPROFEN 600 MG PO TABS: 600.0000 mg | ORAL_TABLET | Freq: Four times a day (QID) | ORAL | Status: AC

## 2013-08-01 MED ORDER — OXYCODONE-ACETAMINOPHEN 5-325 MG PO TABS: 1.0000 | ORAL_TABLET | ORAL | Status: AC | PRN

## 2013-08-01 NOTE — Progress Notes (Signed)
Discharge instructions reviewed with patient and significant other via [employer].  Patient states understanding of home care, activity, medications, signs/symptoms to report to MD and return MD office visit.  No home equipment needed.  Patient ambulated for discharge in stable condition with staff without incident.

## 2013-08-01 NOTE — Discharge Summary (Signed)
Attestation of Attending Supervision of Advanced Practitioner (CNM/NP): Evaluation and management procedures were performed by the Advanced Practitioner under my supervision and collaboration. I have reviewed the Advanced Practitioner's note and chart, and I agree with the management and plan.  Carzell Saldivar H. 4:48 PM   

## 2013-08-01 NOTE — Discharge Summary (Signed)
Obstetric Discharge Summary Reason for Admission: induction of labor due to postdates; preeclampsia noted during admission and mag sulfate given during labor and PP Prenatal Procedures: none Intrapartum Procedures: cesarean: low cervical, transverse for failure to descend/deep transverse arrest Postpartum Procedures: transfusion 2u PRBCs, antibiotics and 24hrs magnesium sulfate therapy post del Complications-Operative and Postpartum: none Hemoglobin  Date Value Range Status  07/31/2013 8.6* 12.0 - 15.0 g/dL Final     HCT  Date Value Range Status  07/31/2013 25.6* 36.0 - 46.0 % Final   Claudia Solis is a 22yo G1P0 at 41+wks admitted on 11/18 for IOL due to postdates, at which point preeclampsia was noted (elevated BPs, plts 149, nl LFTs) and magnesium started. Her cx was 3/90 and Pitocin was started and continued during the night but no cx change was appreciated. During the late afaternoon/night of 11/19 she received cytotec with the hopes of softening her cx. In the morning of 11/20 Pitocin was started once again and active labor was reached. By noon on 11/20 her cx was 9cm and ranged from 9-10cm that afternoon, however with baby was intolerant with pushing and the pt's urine output was minimal. It was determined that a C/S should be offered for failure to descend, and it was accepted by the pt. In the PP period she initially went to AICU to receive magnesium sulfate x 24 hrs. The infant went to NICU for further eval. By POD#1 it was determined that she would benefit from PRBC x 2 due to symptomatic anemia (hgb 7.8 from 9.5). Hgb on POD#2 was 8.6. By POD#3 she was feeling well and was deemed to have received the full benefit of her hospital stay and will be discharged home. She is pumping and desires Depo for contraception (conversation held via [employer]). She will follow up at the [facility] in 4-6 weeks, and will have a BP check by [facility] within one week.  Physical Exam:  General: alert, cooperative  and mild distress Heart: RRR Lungs: nl effort Lochia: appropriate Uterine Fundus: firm Incision: honeycomb dsg present without staining DVT Evaluation: No evidence of DVT seen on physical exam.  Discharge Diagnoses: Term Pregnancy-delivered and Failed induction; PP anemia  Discharge Information: Date: 08/01/2013 Activity: pelvic rest Diet: routine Medications: PNV, Ibuprofen, Iron and Percocet Condition: stable Instructions: refer to practice specific booklet Discharge to: home   Newborn Data: Live born female  Birth Weight: 7 lb 11.6 oz (3504 g) APGAR: 7, 8  Infant in NICU.  Cam Hai 08/01/2013, 11:16 AM

## 2013-08-02 ENCOUNTER — Ambulatory Visit: Payer: Self-pay

## 2013-08-02 NOTE — Lactation Note (Addendum)
This note was copied from the chart of [name]. Lactation Consultation Note    Follow up consult with this mom of a term baby, now 59 days old. Burmese phone interpreter used multiple times today with mom. Mom did not go to [facility] to obtain a DEP on her discharge yesterday, despite a Burmese phone interpreter used for discharge teaching yesterday. Mom in to take baby home, and needed help latching her baby, who has been bottle fed for the last 4 days. Mom reports pumping at home only 3 times since her discharge, and is now leaking lots of milk, very engorged, especially on the left, The baby could not latch with mom so engorged, nipples flattened. I tried a 20 nipple shield, and the baby latched and took 50 mls of EBM by curved tip SNS into nipple shield. Mom was then given a DEP, and was able to express an additional 50 mls from her right  breast, but only 20 from her left. I also got mom a manual hand pump, and showed her how to use it. .   I also got mom 2 blue bags of ice and instructed her to ice every 1-2 hours for 20 minutes at a time, in between pumping every 2-3 hours.I also got in touch with Rexene Edison , from [facility], and hopefully mom will be able to et there in time today to obtain a [facility] DEP. Mom is active with WIC. I also mde an o/p lactation consult with mom for wed. 08/04/13/   Patient Name: Claudia Solis [id number] Date: 08/02/2013 Reason for consult: Follow-up assessment;NICU baby   Maternal Data    Feeding Feeding Type: Breast Milk Length of feed: 30 min  LATCH Score/Interventions Latch: Repeated attempts needed to sustain latch, nipple held in mouth throughout feeding, stimulation needed to elicit sucking reflex. (20 nipple shiled and SNS with curved tip syringe used w good results) Intervention(s): Skin to skin;Teach feeding cues;Waking techniques Intervention(s): Adjust position;Assist with latch;Breast massage;Breast compression (mom very full, nipple flat)  Audible Swallowing:  Spontaneous and intermittent (only with SNS, not after)  Type of Nipple: Flat (hand pump used to evert, reversed pressure, 20 nipple shield) Intervention(s): Reverse pressure;Hand pump;Double electric pump  Comfort (Breast/Nipple): Engorged, cracked, bleeding, large blisters, severe discomfort Problem noted: Engorgment Intervention(s): Ice;Hand expression;Reverse pressure;Other (comment) (dep)     Hold (Positioning): Assistance needed to correctly position infant at breast and maintain latch. Intervention(s): Breastfeeding basics reviewed;Support Pillows;Position options;Skin to skin (telephone Burmese interpreter used for teaching )  LATCH Score: 5  Lactation Tools Discussed/Used WIC Program: Yes (mom sent to [facility] with her fried for a [facility] DEP - Nedra Cox aware mom on her way, mom may have to reschedule for tomrrow, since it is now late in day)   Consult Status Consult Status: Follow-up Date: 08/04/13 Follow-up type: Out-patient (mom has appointment ofr lactation on 11/26 at 1 pm)    Alfred Levins 08/02/2013, 5:10 PM

## 2014-07-11 ENCOUNTER — Encounter (HOSPITAL_COMMUNITY): Payer: Self-pay | Admitting: Obstetrics & Gynecology

## 2015-08-05 IMAGING — US US OB FOLLOW-UP
1 series · 12 of 28 positions shown · non-contrast
Comparison: none

[Series 1: us ob follow up · 12 of 41 slices shown]
[im 2/41]
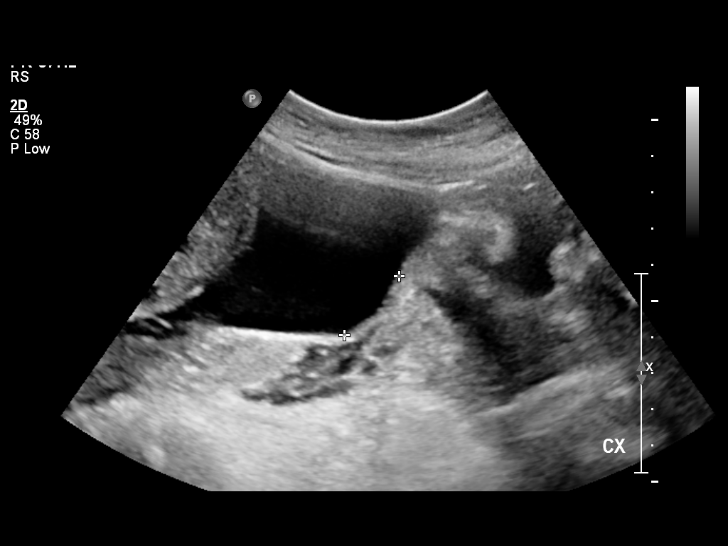
[im 5/41]
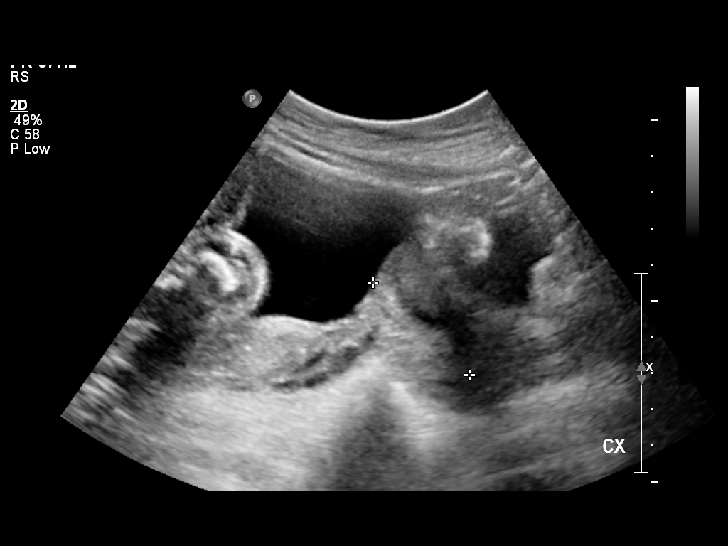
[im 8/41]
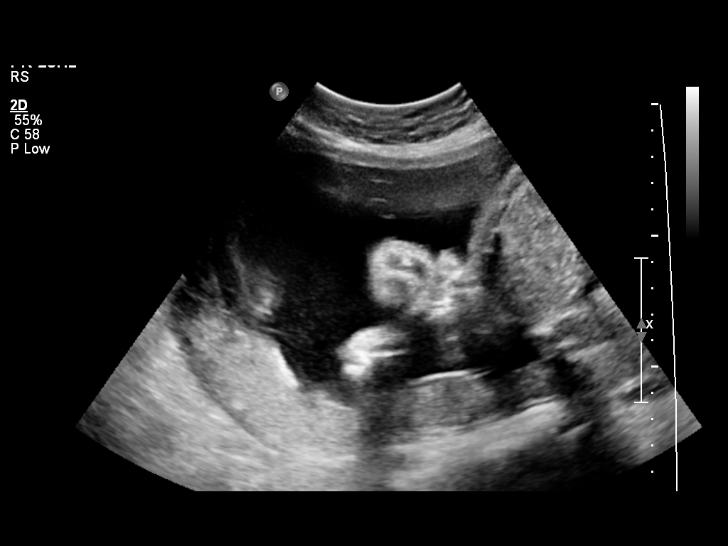
[im 12/41]
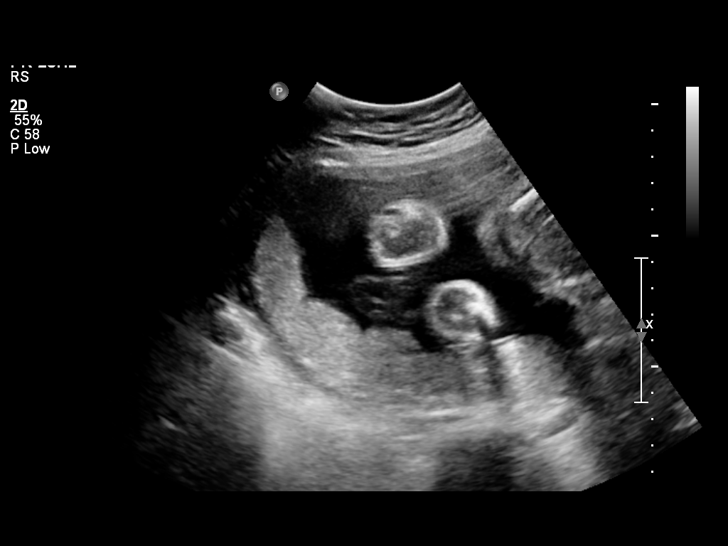
[im 15/41]
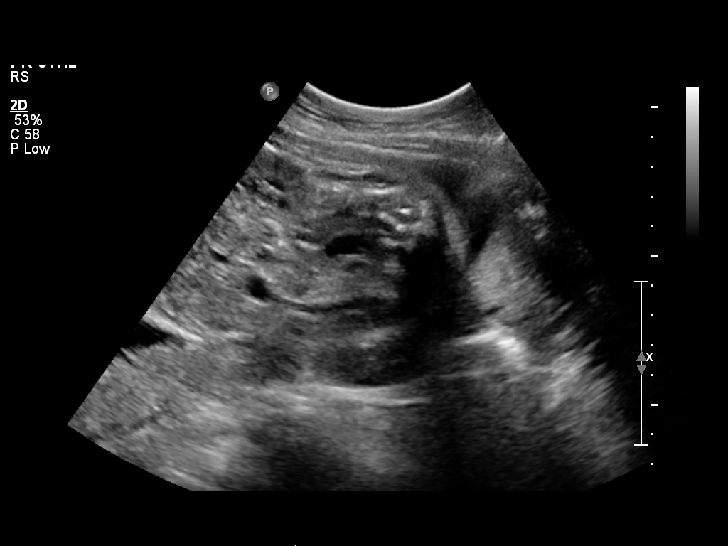
[im 18/41]
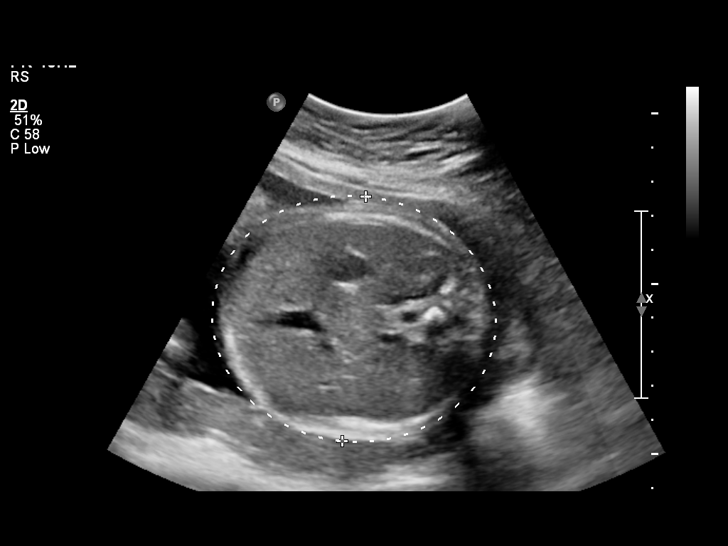
[im 23/41]
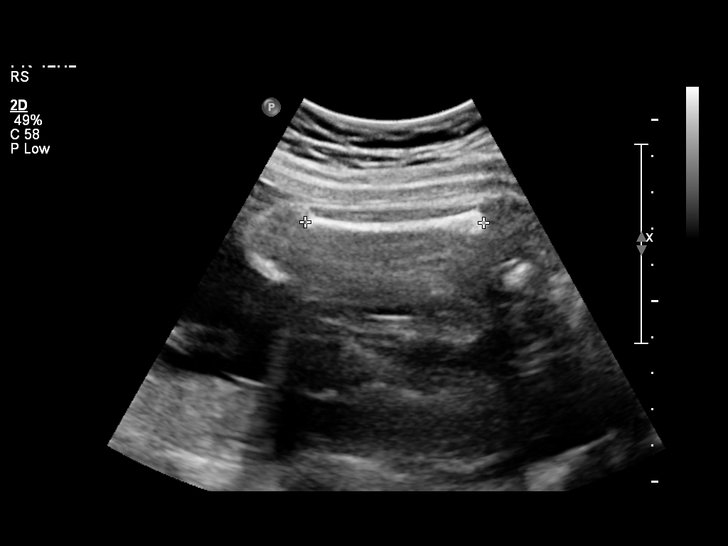
[im 26/41]
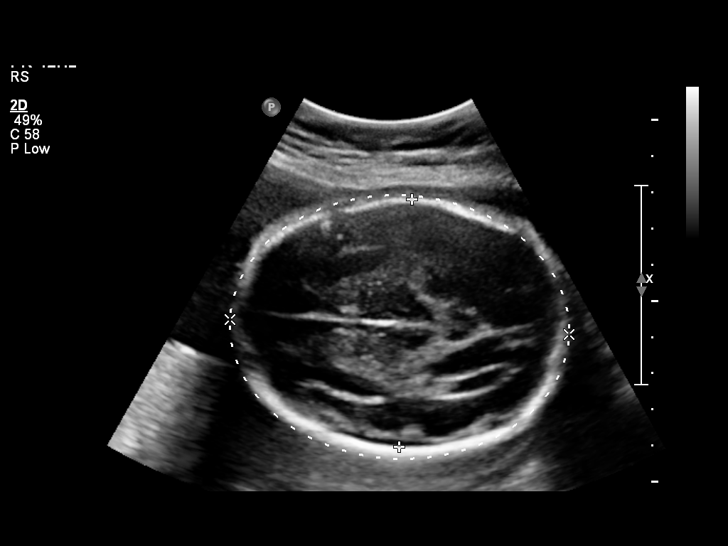
[im 29/41]
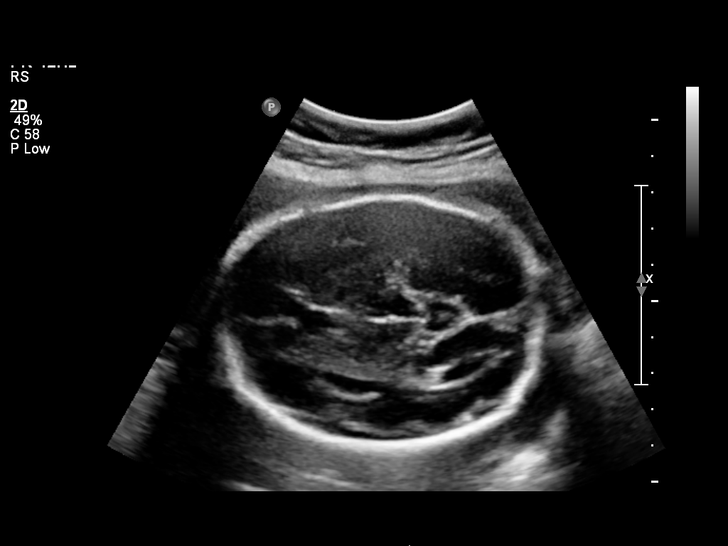
[im 33/41]
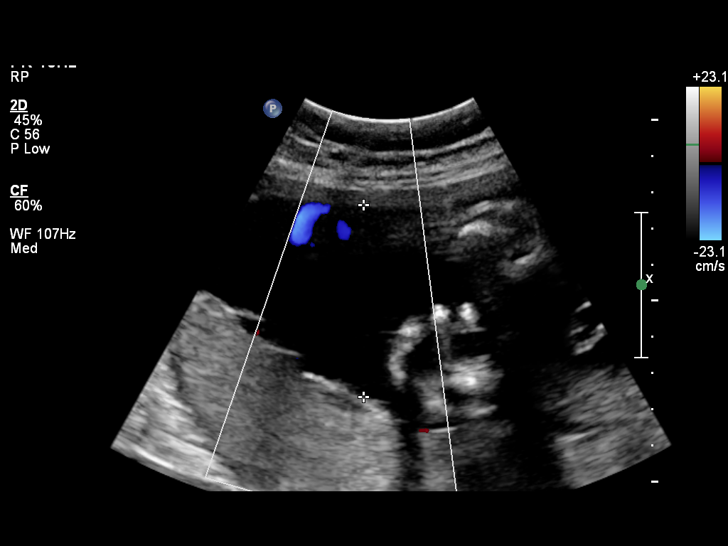
[im 36/41]
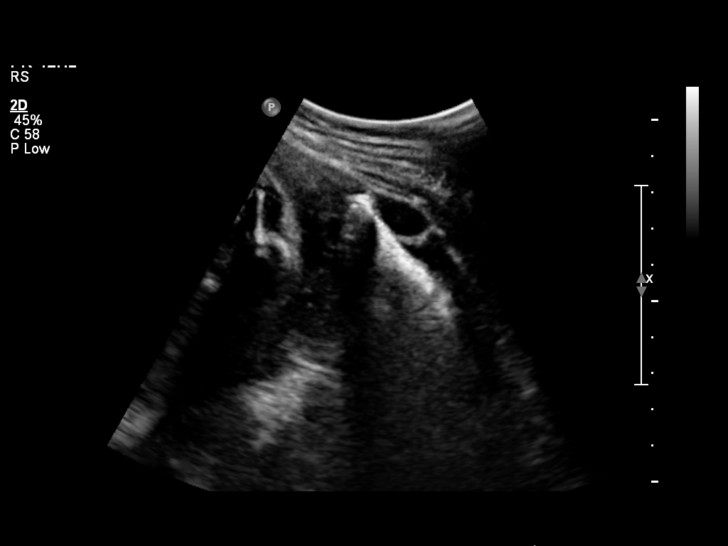
[im 39/41]
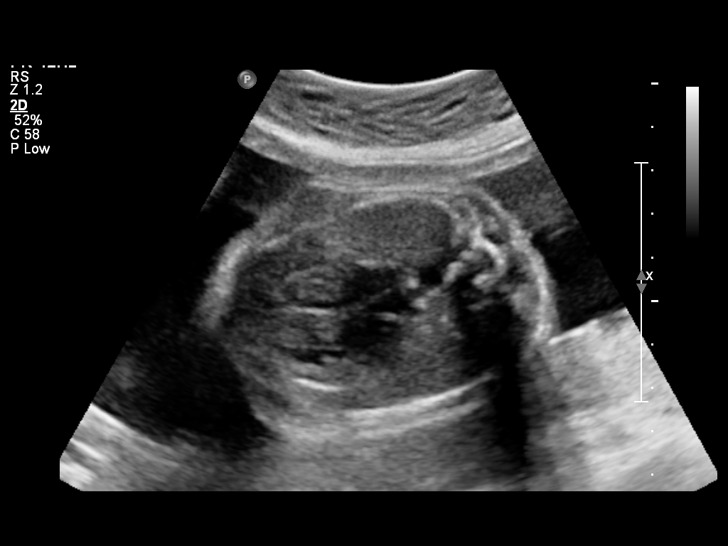

[12 of 28 positions shown; findings below may reference images not displayed]

OBSTETRICS REPORT
                      (Signed Final 04/26/2013 [DATE])

Service(s) Provided

 US OB FOLLOW UP                                       76816.1
Indications

 Placenta previa/Low lying: No bleeding
 Marginal insertion of umbilical cord
Fetal Evaluation

 Num Of Fetuses:    1
 Fetal Heart Rate:  138                         bpm
 Cardiac Activity:  Observed
 Presentation:      Breech
 Placenta:          Posterior, low-lying,1.4
                    cm from int os
 P. Cord            Marginal insertion
 Insertion:

 Amniotic Fluid
 AFI FV:      Subjectively within normal limits
 AFI Sum:     20.91   cm      84   %Tile     Larg Pckt:   6.38   cm
 RUQ:   5.27   cm    RLQ:    6.38   cm    LUQ:   5       cm   LLQ:    4.26   cm
Biometry

 BPD:     67.6  mm    G. Age:   27w 2d                CI:        69.11   70 - 86
                                                      FL/HC:      19.1   18.8 -

 HC:     259.7  mm    G. Age:   28w 2d       19  %    HC/AC:      1.07   1.05 -

 AC:     243.6  mm    G. Age:   28w 5d       53  %    FL/BPD:     73.2   71 - 87
 FL:      49.5  mm    G. Age:   26w 5d        5  %    FL/AC:      20.3   20 - 24

 Est. FW:    7752  gm      2 lb 8 oz     42  %
Gestational Age

 Clinical EDD:  31w 6d                                        EDD:   06/22/13
 U/S Today:     27w 5d                                        EDD:   07/21/13
 Best:          28w 2d    Det. By:   U/S (03/03/13)           EDD:   07/17/13
Anatomy

 Cranium:          Appears normal         Aortic Arch:      Previously seen
 Fetal Cavum:      Previously seen        Ductal Arch:      Previously seen
 Ventricles:       Appears normal         Diaphragm:        Previously seen
 Choroid Plexus:   Previously seen        Stomach:          Appears normal
 Cerebellum:       Previously seen        Abdomen:          Appears normal
 Posterior Fossa:  Previously seen        Abdominal Wall:   Previously seen
 Nuchal Fold:      Not applicable (>20    Cord Vessels:     Appears normal (3
                   wks GA)                                  vessel cord)
 Face:             Orbits previously      Kidneys:          Appear normal
                   seen
 Lips:             Previously seen        Bladder:          Appears normal
 Heart:            Appears normal         Spine:            Previously seen
                   (4CH, axis, and
                   situs)
 RVOT:             Previously seen        Lower             Previously seen
                                          Extremities:
 LVOT:             Previously seen        Upper             Previously seen
                                          Extremities:

 Other:  l Heels and 5th digit previously seen . Fetus appears to be a male.
         Technically difficult due to advanced GA.
Cervix Uterus Adnexa

 Cervical Length:   3.7       cm

 Cervix:       Normal appearance by transabdominal scan.
 Uterus:       No abnormality visualized.
 Cul De Sac:   No free fluid seen.

 Left Ovary:   Not visualized.
 Right Ovary:  Not visualized.
 Adnexa:     No abnormality visualized.
Impression

 Assigned GA is currently 28w 2d.   Appropriate fetal growth,
 with EFW at 42 %ile.
 Amniotic fluid within normal limits, with AFI of 20.91 cm.
 Normal cervical length.
 Persistent low lying placenta, with inferior tip approximately
 1.4cm from internal cervical os.
Recommendations

 Follow-up US to reassess placental position in 6 wks.

 questions or concerns.

## 2015-09-16 IMAGING — US US OB FOLLOW-UP
2 series · 12 of 28 positions shown · non-contrast
Comparison: none

[Series 1: us ob follow up · 46 acquisitions, 11 frames shown (1 of 2)]
[im 2/46]
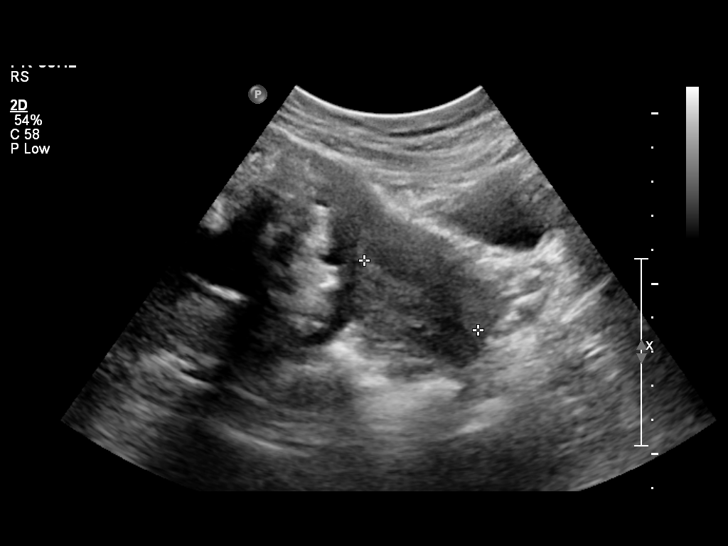
[im 6/46]
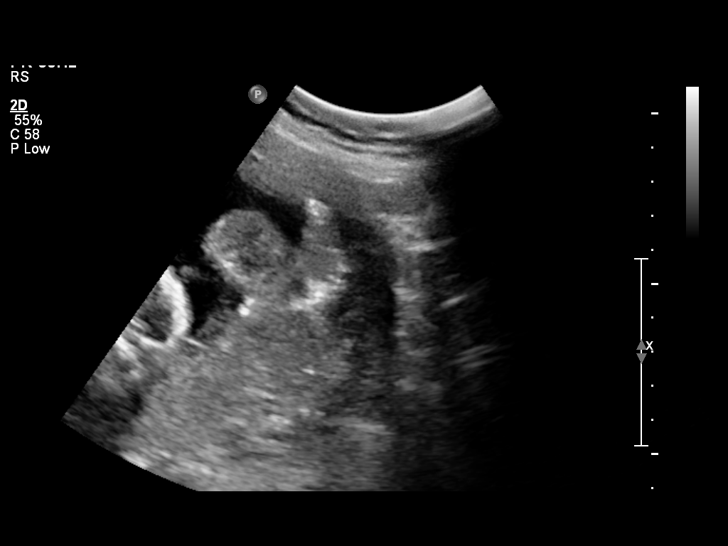
[im 10/46]
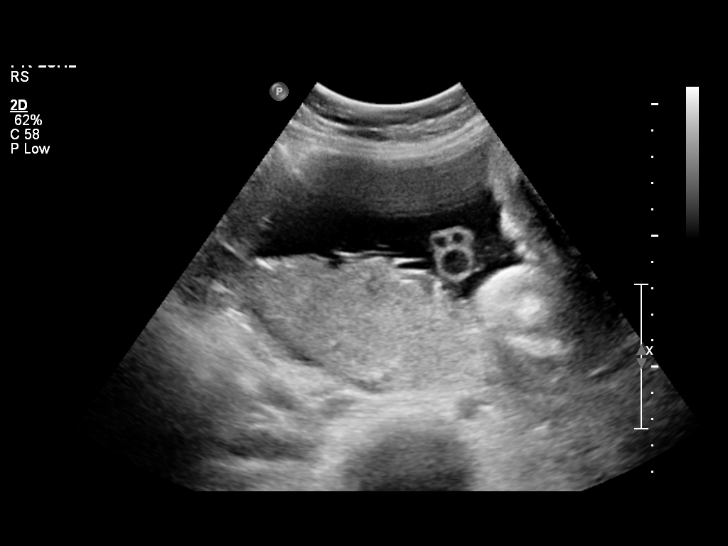
[im 16/46]
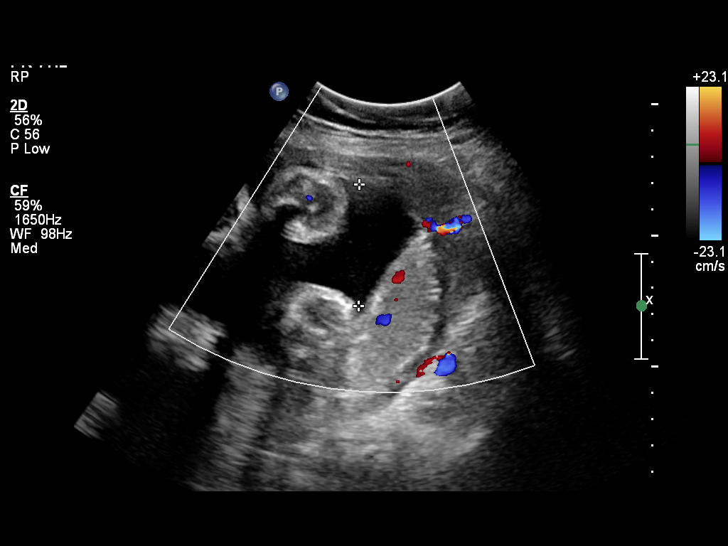
[im 19/46]
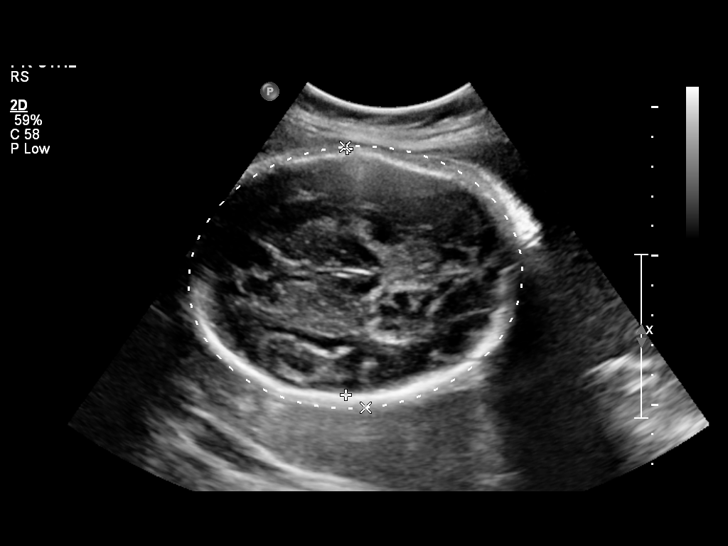
[im 23/46]
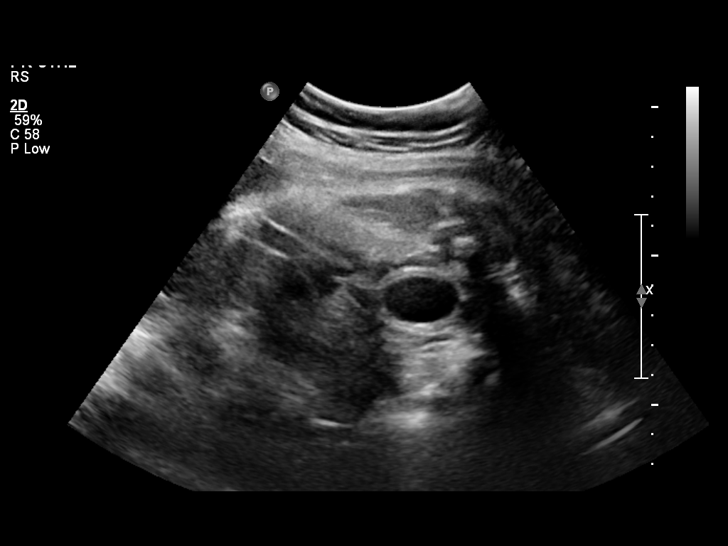
[im 29/46]
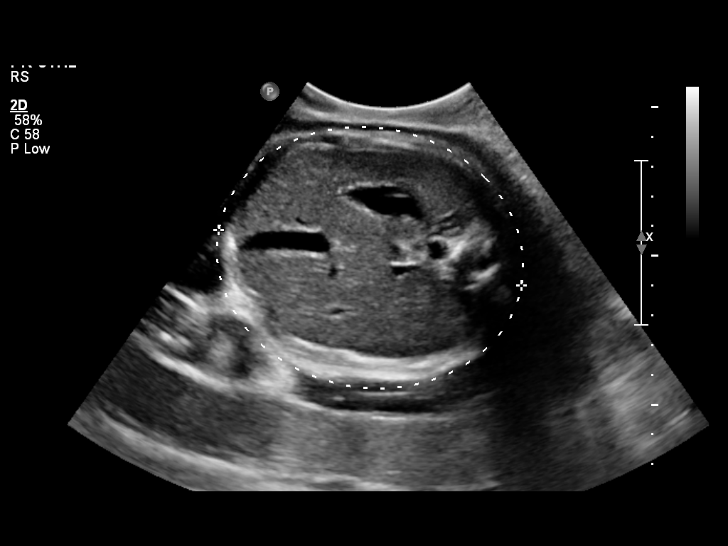
[im 32/46]
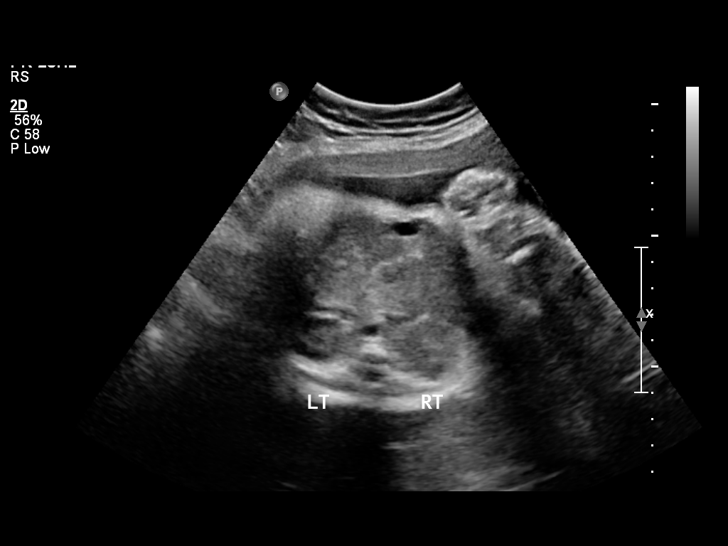
[im 36/46]
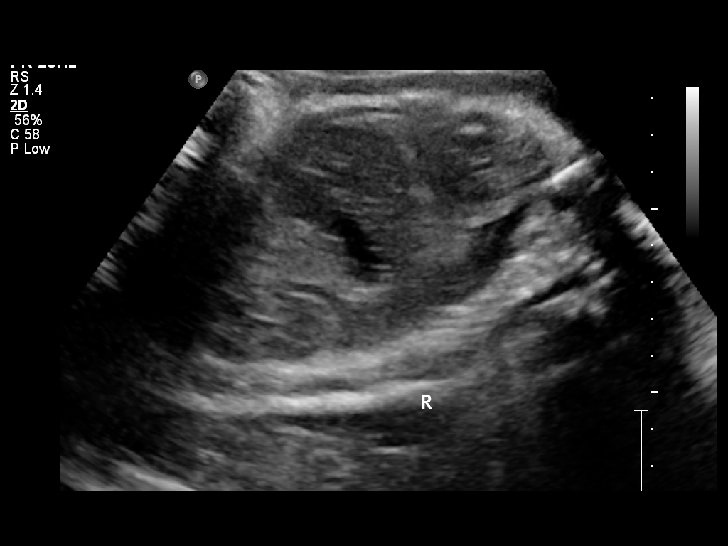
[im 42/46]
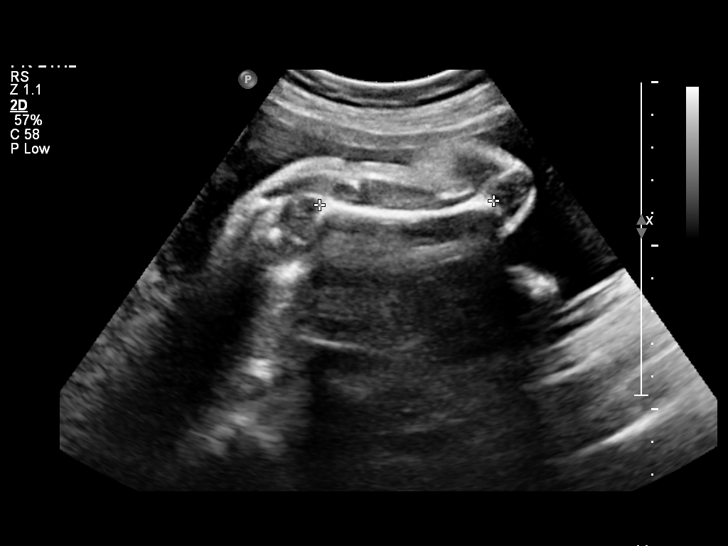
[im 46/46]
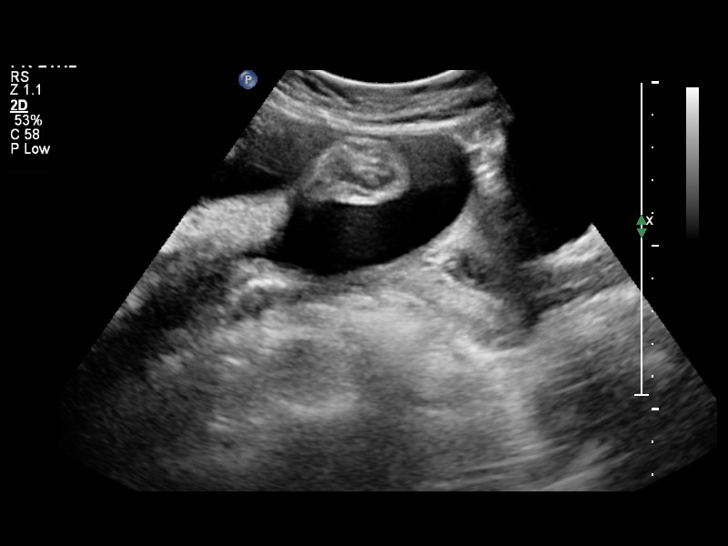

[Series 1: us ob follow up · 1 of 6 slices shown (2 of 2)]
[im 3/6]
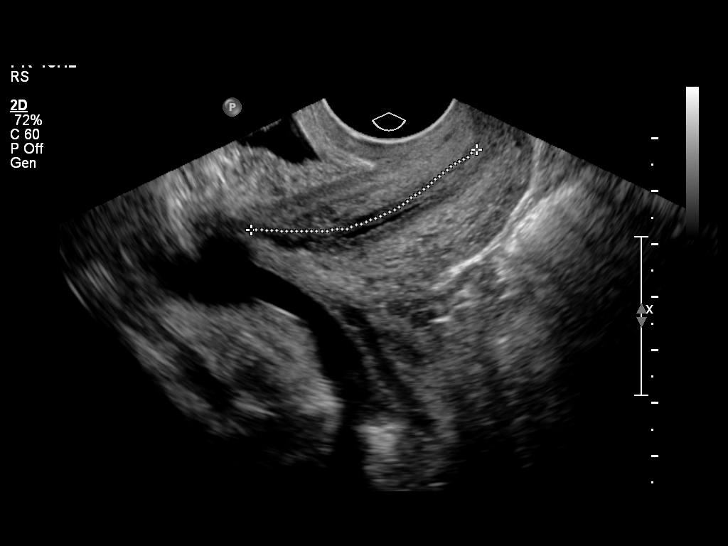

[12 of 28 positions shown; findings below may reference images not displayed]

OBSTETRICS REPORT
                      (Signed Final 06/07/2013 [DATE])

Service(s) Provided

 US OB FOLLOW UP                                       76816.1
 US OB TRANSVAGINAL                                    76817.0
Indications

 Placenta previa/Low lying: No bleeding
 Marginal insertion of umbilical cord
Fetal Evaluation

 Num Of Fetuses:    1
 Fetal Heart Rate:  138                          bpm
 Cardiac Activity:  Observed
 Presentation:      Breech
 Placenta:          Posterior, above cervical
                    os
 P. Cord            Marginal insertion
 Insertion:

 Amniotic Fluid
 AFI FV:      Subjectively within normal limits
 AFI Sum:     19.29   cm       72  %Tile     Larg Pckt:    6.12  cm
 RUQ:   5.48    cm   RLQ:    6.12   cm    LUQ:   3.08    cm   LLQ:    4.61   cm
Biometry

 BPD:     81.9  mm     G. Age:  33w 0d                CI:        68.21   70 - 86
                                                      FL/HC:      19.3   19.4 -

 HC:     317.1  mm     G. Age:  35w 4d       49  %    HC/AC:      1.06   0.96 -

 AC:     298.1  mm     G. Age:  33w 6d       40  %    FL/BPD:     74.7   71 - 87
 FL:      61.2  mm     G. Age:  31w 5d      < 3  %    FL/AC:      20.5   20 - 24
 HUM:       53  mm     G. Age:  30w 6d      < 5  %

 Est. FW:    2181  gm    4 lb 12 oz      41  %
Gestational Age

 Clinical EDD:  37w 6d                                        EDD:   06/22/13
 U/S Today:     33w 4d                                        EDD:   07/22/13
 Best:          34w 2d     Det. By:  U/S (03/03/13)           EDD:   07/17/13
Anatomy

 Cranium:          Appears normal         Aortic Arch:      Previously seen
 Fetal Cavum:      Previously seen        Ductal Arch:      Previously seen
 Ventricles:       Appears normal         Diaphragm:        Previously seen
 Choroid Plexus:   Previously seen        Stomach:          Appears normal
 Cerebellum:       Previously seen        Abdomen:          Appears normal
 Posterior Fossa:  Previously seen        Abdominal Wall:   Previously seen
 Nuchal Fold:      Not applicable (>20    Cord Vessels:     Appears normal (3
                   wks GA)                                  vessel cord)
 Face:             Profile appears        Kidneys:          Appear normal
                   normal
 Lips:             Previously seen        Bladder:          Appears normal
 Heart:            Appears normal         Spine:            Previously seen
                   (4CH, axis, and
                   situs)
 RVOT:             Previously seen        Lower             Previously seen
                                          Extremities:
 LVOT:             Previously seen        Upper             Previously seen
                                          Extremities:

 Other:  l Heels and 5th digit previously seen . Fetus appears to be a male.
         Technically difficult due to advanced GA.
Cervix Uterus Adnexa

 Cervical Length:    4.7      cm

 Cervix:       Normal appearance by transvaginal scan
 Uterus:       No abnormality visualized.

 Left Ovary:    No adnexal mass visualized.
 Right Ovary:   No adnexal mass visualized.

 Adnexa:     No abnormality visualized.
Impression

 Single IUP at 33 [DATE] weeks
 Interval growth is appropriate (41st %tile)
 Normal interval anatomy, although somewhat limited due to
 late gestational age
 A right-sided, posterior placenta is noted without previa.  The
 previously noted low lying placenta has resolved.
 NormaL amniotic fluid volume
Recommendations

 Follow-up ultrasounds as clinically indicated.

 questions or concerns.

## 2018-04-07 NOTE — Congregational Nurse Program (Signed)
Congregational Nurse Program Note  Date of Encounter: 04/07/2018  Past Medical History: Past Medical History:  Diagnosis Date  . Late prenatal care     Encounter Details: CNP Questionnaire - 04/07/18 1128      Questionnaire   Patient Status  Refugee    Race  Native Hawaiian or Other Pacific Islander    [facility] Patient Served [facility]  [facility]    Insurance  Not Applicable    Uninsured  Uninsured (NEW 1x/quarter)    Food  Within past 12 months, worried food would run out with no money to buy more    Housing/Utilities  Yes, have permanent housing    Transportation  No transportation needs    Interpersonal Safety  Yes, feel physically and emotionally safe where you currently live    Medication  Yes, have medication insecurities    Medical Provider  No    Referrals  Urgent Care;[facility]    ED Visit Averted  Not Applicable    Life-Saving Intervention Made  Not Applicable      Office visit for this Burmese speaking lady at [facility] nurse office. Conversed in basic English, but misunderstood more detailed questions. Expressed much concern about pain in right neck since one month. Denies past or recent injury. Area very painful to light pressure along area lateral to trachea. Afebrile. Unable to visually observe nodule or neck swelling. Palpating very painful to area. Pain noted during swallowing food or water, esp. lying flat or during sleep. Non-productive coughs during sleep hours. No PCP due to lack of affordable insurance. [facility] and to [facility]gent Care for medical evaluation today. [facility] Ferol LuzMarietta Donald Jacque, RN/CN

## 2018-04-10 NOTE — Congregational Nurse Program (Signed)
Congregational Nurse Program Note  Date of Encounter: 04/10/2018  Past Medical History: Past Medical History:  Diagnosis Date  . Late prenatal care     Encounter Details: CNP Questionnaire - 04/10/18 1354      Questionnaire   Patient Status  Refugee    Race  Native Hawaiian or Other Pacific Islander    Location Patient Served At  Not Applicable    Insurance  Not Applicable    Uninsured  Uninsured (Subsequent visits/quarter)    Food  Within past 12 months, worried food would run out with no money to buy more    Housing/Utilities  Yes, have permanent housing    Transportation  No transportation needs    Interpersonal Safety  Yes, feel physically and emotionally safe where you currently live    Medication  Yes, have medication insecurities    Medical Provider  No    Referrals  Not Applicable    ED Visit Averted  Not Applicable    Life-Saving Intervention Made  Not Applicable      Ms Wesely came in for blood Pressure check. Health education provided on diet and exercise. Nicole Cellaorothy Muhoro RN BSN [facility] CNP\ [phone]

## 2018-05-29 NOTE — Congregational Nurse Program (Signed)
Congregational Nurse Program Note  Date of Encounter: 05/29/2018  Past Medical History: Past Medical History:  Diagnosis Date  . Late prenatal care     Encounter Details: CNP Questionnaire - 05/29/18 1300      Questionnaire   Patient Status  Refugee    Race  Native Hawaiian or Other Pacific Islander    Location Patient Served At  Not Applicable    Insurance  Not Applicable    Uninsured  Uninsured (Subsequent visits/quarter)    Food  Within past 12 months, worried food would run out with no money to buy more    Housing/Utilities  Yes, have permanent housing    Transportation  No transportation needs    Interpersonal Safety  Yes, feel physically and emotionally safe where you currently live    Medication  Yes, have medication insecurities    Medical Provider  No    Referrals  Not Applicable    ED Visit Averted  Not Applicable    Life-Saving Intervention Made  Not Applicable      Client  concern about her sons Medicaid that was cancelled. I called DSS and they where able to explain why it was cancelled. Mother will have to comde in person for further assistance and they will be provided with interpreter for her.She understood.[id number]Axton Cihlar RN BSN [facility] PhD. [phone].

## 2018-07-20 NOTE — Congregational Nurse Program (Signed)
Ms Claudia Solis came in for flu vaccine and for blood pressure check. She is also worried about food security in her household. She has been referred to area food banks. Arman Bogus RN BSN PCCN [phone]

## 2020-06-02 DIAGNOSIS — O208 Other hemorrhage in early pregnancy: Secondary | ICD-10-CM | POA: Diagnosis not present

## 2020-06-02 DIAGNOSIS — Z3A10 10 weeks gestation of pregnancy: Secondary | ICD-10-CM | POA: Diagnosis not present

## 2020-06-02 DIAGNOSIS — O209 Hemorrhage in early pregnancy, unspecified: Secondary | ICD-10-CM | POA: Diagnosis not present

## 2020-06-05 DIAGNOSIS — O200 Threatened abortion: Secondary | ICD-10-CM | POA: Diagnosis not present

## 2020-06-05 DIAGNOSIS — B951 Streptococcus, group B, as the cause of diseases classified elsewhere: Secondary | ICD-10-CM | POA: Diagnosis not present

## 2020-06-05 DIAGNOSIS — Z3A10 10 weeks gestation of pregnancy: Secondary | ICD-10-CM | POA: Diagnosis not present

## 2020-06-05 DIAGNOSIS — O2341 Unspecified infection of urinary tract in pregnancy, first trimester: Secondary | ICD-10-CM | POA: Diagnosis not present

## 2020-06-12 DIAGNOSIS — Z3A11 11 weeks gestation of pregnancy: Secondary | ICD-10-CM | POA: Diagnosis not present

## 2020-06-12 DIAGNOSIS — O34219 Maternal care for unspecified type scar from previous cesarean delivery: Secondary | ICD-10-CM | POA: Diagnosis not present

## 2020-07-10 DIAGNOSIS — Z3A15 15 weeks gestation of pregnancy: Secondary | ICD-10-CM | POA: Diagnosis not present

## 2020-07-10 DIAGNOSIS — O34212 Maternal care for vertical scar from previous cesarean delivery: Secondary | ICD-10-CM | POA: Diagnosis not present

## 2020-07-10 DIAGNOSIS — Z23 Encounter for immunization: Secondary | ICD-10-CM | POA: Diagnosis not present

## 2020-07-10 DIAGNOSIS — O34219 Maternal care for unspecified type scar from previous cesarean delivery: Secondary | ICD-10-CM | POA: Diagnosis not present

## 2020-07-10 DIAGNOSIS — O200 Threatened abortion: Secondary | ICD-10-CM | POA: Diagnosis not present

## 2020-07-24 DIAGNOSIS — Z363 Encounter for antenatal screening for malformations: Secondary | ICD-10-CM | POA: Diagnosis not present

## 2020-07-24 DIAGNOSIS — Z36 Encounter for antenatal screening for chromosomal anomalies: Secondary | ICD-10-CM | POA: Diagnosis not present

## 2020-07-24 DIAGNOSIS — Z3A17 17 weeks gestation of pregnancy: Secondary | ICD-10-CM | POA: Diagnosis not present

## 2020-07-24 DIAGNOSIS — O34219 Maternal care for unspecified type scar from previous cesarean delivery: Secondary | ICD-10-CM | POA: Diagnosis not present

## 2020-07-24 DIAGNOSIS — O99212 Obesity complicating pregnancy, second trimester: Secondary | ICD-10-CM | POA: Diagnosis not present

## 2020-07-24 DIAGNOSIS — E669 Obesity, unspecified: Secondary | ICD-10-CM | POA: Diagnosis not present

## 2020-08-07 DIAGNOSIS — O34219 Maternal care for unspecified type scar from previous cesarean delivery: Secondary | ICD-10-CM | POA: Diagnosis not present

## 2020-08-07 DIAGNOSIS — Z3A19 19 weeks gestation of pregnancy: Secondary | ICD-10-CM | POA: Diagnosis not present

## 2020-09-04 DIAGNOSIS — O99212 Obesity complicating pregnancy, second trimester: Secondary | ICD-10-CM | POA: Diagnosis not present

## 2020-09-04 DIAGNOSIS — O34211 Maternal care for low transverse scar from previous cesarean delivery: Secondary | ICD-10-CM | POA: Diagnosis not present

## 2020-09-04 DIAGNOSIS — Z3A23 23 weeks gestation of pregnancy: Secondary | ICD-10-CM | POA: Diagnosis not present

## 2020-09-04 DIAGNOSIS — O34219 Maternal care for unspecified type scar from previous cesarean delivery: Secondary | ICD-10-CM | POA: Diagnosis not present

## 2020-09-04 DIAGNOSIS — E669 Obesity, unspecified: Secondary | ICD-10-CM | POA: Diagnosis not present

## 2020-10-02 DIAGNOSIS — Z3A27 27 weeks gestation of pregnancy: Secondary | ICD-10-CM | POA: Diagnosis not present

## 2020-10-02 DIAGNOSIS — O99212 Obesity complicating pregnancy, second trimester: Secondary | ICD-10-CM | POA: Diagnosis not present

## 2020-10-02 DIAGNOSIS — O34211 Maternal care for low transverse scar from previous cesarean delivery: Secondary | ICD-10-CM | POA: Diagnosis not present

## 2020-10-02 DIAGNOSIS — O34219 Maternal care for unspecified type scar from previous cesarean delivery: Secondary | ICD-10-CM | POA: Diagnosis not present

## 2020-10-02 DIAGNOSIS — Z23 Encounter for immunization: Secondary | ICD-10-CM | POA: Diagnosis not present

## 2020-10-02 DIAGNOSIS — E669 Obesity, unspecified: Secondary | ICD-10-CM | POA: Diagnosis not present

## 2020-10-24 DIAGNOSIS — Z3A31 31 weeks gestation of pregnancy: Secondary | ICD-10-CM | POA: Diagnosis not present

## 2020-10-24 DIAGNOSIS — O34219 Maternal care for unspecified type scar from previous cesarean delivery: Secondary | ICD-10-CM | POA: Diagnosis not present

## 2020-10-24 DIAGNOSIS — E669 Obesity, unspecified: Secondary | ICD-10-CM | POA: Diagnosis not present

## 2020-10-24 DIAGNOSIS — O99213 Obesity complicating pregnancy, third trimester: Secondary | ICD-10-CM | POA: Diagnosis not present

## 2020-11-06 DIAGNOSIS — O99213 Obesity complicating pregnancy, third trimester: Secondary | ICD-10-CM | POA: Diagnosis not present

## 2020-11-06 DIAGNOSIS — O34211 Maternal care for low transverse scar from previous cesarean delivery: Secondary | ICD-10-CM | POA: Diagnosis not present

## 2020-11-06 DIAGNOSIS — E669 Obesity, unspecified: Secondary | ICD-10-CM | POA: Diagnosis not present

## 2020-11-06 DIAGNOSIS — Z8759 Personal history of other complications of pregnancy, childbirth and the puerperium: Secondary | ICD-10-CM | POA: Diagnosis not present

## 2020-11-06 DIAGNOSIS — Z3689 Encounter for other specified antenatal screening: Secondary | ICD-10-CM | POA: Diagnosis not present

## 2020-11-06 DIAGNOSIS — O34219 Maternal care for unspecified type scar from previous cesarean delivery: Secondary | ICD-10-CM | POA: Diagnosis not present

## 2020-11-06 DIAGNOSIS — Z3A32 32 weeks gestation of pregnancy: Secondary | ICD-10-CM | POA: Diagnosis not present

## 2020-11-07 DIAGNOSIS — R519 Headache, unspecified: Secondary | ICD-10-CM | POA: Diagnosis not present

## 2020-11-07 DIAGNOSIS — N858 Other specified noninflammatory disorders of uterus: Secondary | ICD-10-CM | POA: Diagnosis not present

## 2020-11-07 DIAGNOSIS — O26893 Other specified pregnancy related conditions, third trimester: Secondary | ICD-10-CM | POA: Diagnosis not present

## 2020-11-07 DIAGNOSIS — O34219 Maternal care for unspecified type scar from previous cesarean delivery: Secondary | ICD-10-CM | POA: Diagnosis not present

## 2020-11-07 DIAGNOSIS — O99213 Obesity complicating pregnancy, third trimester: Secondary | ICD-10-CM | POA: Diagnosis not present

## 2020-11-07 DIAGNOSIS — E669 Obesity, unspecified: Secondary | ICD-10-CM | POA: Diagnosis not present

## 2020-11-07 DIAGNOSIS — R030 Elevated blood-pressure reading, without diagnosis of hypertension: Secondary | ICD-10-CM | POA: Diagnosis not present

## 2020-11-07 DIAGNOSIS — Z3A33 33 weeks gestation of pregnancy: Secondary | ICD-10-CM | POA: Diagnosis not present

## 2020-11-07 DIAGNOSIS — O09293 Supervision of pregnancy with other poor reproductive or obstetric history, third trimester: Secondary | ICD-10-CM | POA: Diagnosis not present

## 2020-11-10 DIAGNOSIS — O26893 Other specified pregnancy related conditions, third trimester: Secondary | ICD-10-CM | POA: Diagnosis not present

## 2020-11-10 DIAGNOSIS — Z3A33 33 weeks gestation of pregnancy: Secondary | ICD-10-CM | POA: Diagnosis not present

## 2020-11-10 DIAGNOSIS — R030 Elevated blood-pressure reading, without diagnosis of hypertension: Secondary | ICD-10-CM | POA: Diagnosis not present

## 2020-11-15 DIAGNOSIS — O34219 Maternal care for unspecified type scar from previous cesarean delivery: Secondary | ICD-10-CM | POA: Diagnosis not present

## 2020-11-15 DIAGNOSIS — E669 Obesity, unspecified: Secondary | ICD-10-CM | POA: Diagnosis not present

## 2020-11-15 DIAGNOSIS — O99212 Obesity complicating pregnancy, second trimester: Secondary | ICD-10-CM | POA: Diagnosis not present

## 2020-11-15 DIAGNOSIS — Z3A34 34 weeks gestation of pregnancy: Secondary | ICD-10-CM | POA: Diagnosis not present

## 2020-11-29 DIAGNOSIS — E669 Obesity, unspecified: Secondary | ICD-10-CM | POA: Diagnosis not present

## 2020-11-29 DIAGNOSIS — Z3685 Encounter for antenatal screening for Streptococcus B: Secondary | ICD-10-CM | POA: Diagnosis not present

## 2020-11-29 DIAGNOSIS — O34211 Maternal care for low transverse scar from previous cesarean delivery: Secondary | ICD-10-CM | POA: Diagnosis not present

## 2020-11-29 DIAGNOSIS — Z3A36 36 weeks gestation of pregnancy: Secondary | ICD-10-CM | POA: Diagnosis not present

## 2020-11-29 DIAGNOSIS — Z113 Encounter for screening for infections with a predominantly sexual mode of transmission: Secondary | ICD-10-CM | POA: Diagnosis not present

## 2020-11-29 DIAGNOSIS — R030 Elevated blood-pressure reading, without diagnosis of hypertension: Secondary | ICD-10-CM | POA: Diagnosis not present

## 2020-11-29 DIAGNOSIS — O26893 Other specified pregnancy related conditions, third trimester: Secondary | ICD-10-CM | POA: Diagnosis not present

## 2020-11-29 DIAGNOSIS — O99891 Other specified diseases and conditions complicating pregnancy: Secondary | ICD-10-CM | POA: Diagnosis not present

## 2020-11-29 DIAGNOSIS — O99213 Obesity complicating pregnancy, third trimester: Secondary | ICD-10-CM | POA: Diagnosis not present

## 2020-11-29 DIAGNOSIS — Z789 Other specified health status: Secondary | ICD-10-CM | POA: Diagnosis not present

## 2020-11-29 DIAGNOSIS — Z3689 Encounter for other specified antenatal screening: Secondary | ICD-10-CM | POA: Diagnosis not present

## 2020-12-05 DIAGNOSIS — O34219 Maternal care for unspecified type scar from previous cesarean delivery: Secondary | ICD-10-CM | POA: Diagnosis not present

## 2020-12-05 DIAGNOSIS — R030 Elevated blood-pressure reading, without diagnosis of hypertension: Secondary | ICD-10-CM | POA: Diagnosis not present

## 2020-12-05 DIAGNOSIS — O99212 Obesity complicating pregnancy, second trimester: Secondary | ICD-10-CM | POA: Diagnosis not present

## 2020-12-05 DIAGNOSIS — O99820 Streptococcus B carrier state complicating pregnancy: Secondary | ICD-10-CM | POA: Diagnosis not present

## 2020-12-05 DIAGNOSIS — Z789 Other specified health status: Secondary | ICD-10-CM | POA: Diagnosis not present

## 2020-12-05 DIAGNOSIS — Z3A37 37 weeks gestation of pregnancy: Secondary | ICD-10-CM | POA: Diagnosis not present

## 2020-12-05 DIAGNOSIS — O99213 Obesity complicating pregnancy, third trimester: Secondary | ICD-10-CM | POA: Diagnosis not present

## 2020-12-05 DIAGNOSIS — O99824 Streptococcus B carrier state complicating childbirth: Secondary | ICD-10-CM | POA: Diagnosis not present

## 2020-12-05 DIAGNOSIS — O26893 Other specified pregnancy related conditions, third trimester: Secondary | ICD-10-CM | POA: Diagnosis not present

## 2020-12-05 DIAGNOSIS — E669 Obesity, unspecified: Secondary | ICD-10-CM | POA: Diagnosis not present

## 2020-12-14 DIAGNOSIS — O99213 Obesity complicating pregnancy, third trimester: Secondary | ICD-10-CM | POA: Diagnosis not present

## 2020-12-14 DIAGNOSIS — Z789 Other specified health status: Secondary | ICD-10-CM | POA: Diagnosis not present

## 2020-12-14 DIAGNOSIS — O34219 Maternal care for unspecified type scar from previous cesarean delivery: Secondary | ICD-10-CM | POA: Diagnosis not present

## 2020-12-14 DIAGNOSIS — E669 Obesity, unspecified: Secondary | ICD-10-CM | POA: Diagnosis not present

## 2020-12-14 DIAGNOSIS — Z3A38 38 weeks gestation of pregnancy: Secondary | ICD-10-CM | POA: Diagnosis not present

## 2020-12-14 DIAGNOSIS — O34211 Maternal care for low transverse scar from previous cesarean delivery: Secondary | ICD-10-CM | POA: Diagnosis not present

## 2020-12-14 DIAGNOSIS — R030 Elevated blood-pressure reading, without diagnosis of hypertension: Secondary | ICD-10-CM | POA: Diagnosis not present

## 2020-12-14 DIAGNOSIS — O99824 Streptococcus B carrier state complicating childbirth: Secondary | ICD-10-CM | POA: Diagnosis not present

## 2020-12-14 DIAGNOSIS — O99820 Streptococcus B carrier state complicating pregnancy: Secondary | ICD-10-CM | POA: Diagnosis not present

## 2020-12-14 DIAGNOSIS — O26893 Other specified pregnancy related conditions, third trimester: Secondary | ICD-10-CM | POA: Diagnosis not present

## 2020-12-21 DIAGNOSIS — O34211 Maternal care for low transverse scar from previous cesarean delivery: Secondary | ICD-10-CM | POA: Diagnosis not present

## 2020-12-21 DIAGNOSIS — O328XX0 Maternal care for other malpresentation of fetus, not applicable or unspecified: Secondary | ICD-10-CM | POA: Diagnosis not present

## 2020-12-21 DIAGNOSIS — O99820 Streptococcus B carrier state complicating pregnancy: Secondary | ICD-10-CM | POA: Diagnosis not present

## 2020-12-21 DIAGNOSIS — Z302 Encounter for sterilization: Secondary | ICD-10-CM | POA: Diagnosis not present

## 2020-12-21 DIAGNOSIS — O139 Gestational [pregnancy-induced] hypertension without significant proteinuria, unspecified trimester: Secondary | ICD-10-CM | POA: Diagnosis not present

## 2020-12-21 DIAGNOSIS — D62 Acute posthemorrhagic anemia: Secondary | ICD-10-CM | POA: Diagnosis not present

## 2020-12-21 DIAGNOSIS — O9902 Anemia complicating childbirth: Secondary | ICD-10-CM | POA: Diagnosis not present

## 2020-12-21 DIAGNOSIS — O134 Gestational [pregnancy-induced] hypertension without significant proteinuria, complicating childbirth: Secondary | ICD-10-CM | POA: Diagnosis not present

## 2020-12-21 DIAGNOSIS — E669 Obesity, unspecified: Secondary | ICD-10-CM | POA: Diagnosis not present

## 2020-12-21 DIAGNOSIS — O99214 Obesity complicating childbirth: Secondary | ICD-10-CM | POA: Diagnosis not present

## 2020-12-21 DIAGNOSIS — O99213 Obesity complicating pregnancy, third trimester: Secondary | ICD-10-CM | POA: Diagnosis not present

## 2020-12-21 DIAGNOSIS — K660 Peritoneal adhesions (postprocedural) (postinfection): Secondary | ICD-10-CM | POA: Diagnosis not present

## 2020-12-21 DIAGNOSIS — Z789 Other specified health status: Secondary | ICD-10-CM | POA: Diagnosis not present

## 2020-12-21 DIAGNOSIS — Z3A39 39 weeks gestation of pregnancy: Secondary | ICD-10-CM | POA: Diagnosis not present

## 2020-12-21 DIAGNOSIS — O99824 Streptococcus B carrier state complicating childbirth: Secondary | ICD-10-CM | POA: Diagnosis not present

## 2020-12-21 DIAGNOSIS — Z603 Acculturation difficulty: Secondary | ICD-10-CM | POA: Diagnosis not present

## 2020-12-21 DIAGNOSIS — R188 Other ascites: Secondary | ICD-10-CM | POA: Diagnosis not present

## 2020-12-22 DIAGNOSIS — N736 Female pelvic peritoneal adhesions (postinfective): Secondary | ICD-10-CM | POA: Diagnosis not present

## 2020-12-22 DIAGNOSIS — O134 Gestational [pregnancy-induced] hypertension without significant proteinuria, complicating childbirth: Secondary | ICD-10-CM | POA: Diagnosis not present

## 2020-12-22 DIAGNOSIS — Z302 Encounter for sterilization: Secondary | ICD-10-CM | POA: Diagnosis not present

## 2020-12-22 DIAGNOSIS — O34211 Maternal care for low transverse scar from previous cesarean delivery: Secondary | ICD-10-CM | POA: Diagnosis not present

## 2020-12-22 DIAGNOSIS — Z3A39 39 weeks gestation of pregnancy: Secondary | ICD-10-CM | POA: Diagnosis not present

## 2020-12-22 DIAGNOSIS — O99892 Other specified diseases and conditions complicating childbirth: Secondary | ICD-10-CM | POA: Diagnosis not present

## 2020-12-22 DIAGNOSIS — O99214 Obesity complicating childbirth: Secondary | ICD-10-CM | POA: Diagnosis not present

## 2020-12-22 DIAGNOSIS — E669 Obesity, unspecified: Secondary | ICD-10-CM | POA: Diagnosis not present

## 2020-12-22 DIAGNOSIS — O43893 Other placental disorders, third trimester: Secondary | ICD-10-CM | POA: Diagnosis not present

## 2020-12-23 DIAGNOSIS — D62 Acute posthemorrhagic anemia: Secondary | ICD-10-CM | POA: Diagnosis not present

## 2020-12-23 DIAGNOSIS — O135 Gestational [pregnancy-induced] hypertension without significant proteinuria, complicating the puerperium: Secondary | ICD-10-CM | POA: Diagnosis not present

## 2020-12-23 DIAGNOSIS — O9903 Anemia complicating the puerperium: Secondary | ICD-10-CM | POA: Diagnosis not present

## 2020-12-24 DIAGNOSIS — O9903 Anemia complicating the puerperium: Secondary | ICD-10-CM | POA: Diagnosis not present

## 2020-12-24 DIAGNOSIS — D62 Acute posthemorrhagic anemia: Secondary | ICD-10-CM | POA: Diagnosis not present

## 2020-12-24 DIAGNOSIS — O135 Gestational [pregnancy-induced] hypertension without significant proteinuria, complicating the puerperium: Secondary | ICD-10-CM | POA: Diagnosis not present

## 2020-12-25 DIAGNOSIS — O135 Gestational [pregnancy-induced] hypertension without significant proteinuria, complicating the puerperium: Secondary | ICD-10-CM | POA: Diagnosis not present

## 2020-12-25 DIAGNOSIS — O9903 Anemia complicating the puerperium: Secondary | ICD-10-CM | POA: Diagnosis not present

## 2020-12-25 DIAGNOSIS — D62 Acute posthemorrhagic anemia: Secondary | ICD-10-CM | POA: Diagnosis not present

## 2020-12-25 DIAGNOSIS — R141 Gas pain: Secondary | ICD-10-CM | POA: Diagnosis not present

## 2020-12-25 DIAGNOSIS — O9089 Other complications of the puerperium, not elsewhere classified: Secondary | ICD-10-CM | POA: Diagnosis not present

## 2020-12-27 ENCOUNTER — Telehealth: Payer: Self-pay

## 2020-12-27 NOTE — Telephone Encounter (Signed)
Transition Care Management Unsuccessful Follow-up Telephone Call  Date of discharge and from where:  12/26/2020 from [facility]  Attempts:  1st Attempt  Reason for unsuccessful TCM follow-up call:  Missing or invalid number

## 2020-12-28 NOTE — Telephone Encounter (Signed)
Transition Care Management Unsuccessful Follow-up Telephone Call  Date of discharge and from where:  12/26/2020 from [facility]  Attempts:  2nd Attempt  Reason for unsuccessful TCM follow-up call:  Missing or invalid number

## 2020-12-29 NOTE — Telephone Encounter (Signed)
Transition Care Management Unsuccessful Follow-up Telephone Call  Date of discharge and from where:  12/26/2020 from [facility]  Attempts:  3rd Attempt  Reason for unsuccessful TCM follow-up call:  Unable to reach patient

## 2021-01-30 DIAGNOSIS — O135 Gestational [pregnancy-induced] hypertension without significant proteinuria, complicating the puerperium: Secondary | ICD-10-CM | POA: Diagnosis not present

## 2021-01-30 DIAGNOSIS — Z9851 Tubal ligation status: Secondary | ICD-10-CM | POA: Diagnosis not present

## 2021-01-30 DIAGNOSIS — I10 Essential (primary) hypertension: Secondary | ICD-10-CM | POA: Diagnosis not present

## 2021-02-06 DIAGNOSIS — Z9851 Tubal ligation status: Secondary | ICD-10-CM | POA: Diagnosis not present

## 2021-02-06 DIAGNOSIS — R030 Elevated blood-pressure reading, without diagnosis of hypertension: Secondary | ICD-10-CM | POA: Diagnosis not present

## 2021-02-06 DIAGNOSIS — O9089 Other complications of the puerperium, not elsewhere classified: Secondary | ICD-10-CM | POA: Diagnosis not present

## 2021-02-06 DIAGNOSIS — Z48816 Encounter for surgical aftercare following surgery on the genitourinary system: Secondary | ICD-10-CM | POA: Diagnosis not present

## 2021-04-09 DIAGNOSIS — Z6832 Body mass index (BMI) 32.0-32.9, adult: Secondary | ICD-10-CM | POA: Diagnosis not present

## 2021-04-09 DIAGNOSIS — E669 Obesity, unspecified: Secondary | ICD-10-CM | POA: Diagnosis not present

## 2021-04-09 DIAGNOSIS — Z8759 Personal history of other complications of pregnancy, childbirth and the puerperium: Secondary | ICD-10-CM | POA: Diagnosis not present

## 2021-04-09 DIAGNOSIS — D649 Anemia, unspecified: Secondary | ICD-10-CM | POA: Diagnosis not present

## 2021-04-09 DIAGNOSIS — I10 Essential (primary) hypertension: Secondary | ICD-10-CM | POA: Diagnosis not present

## 2021-04-09 DIAGNOSIS — D500 Iron deficiency anemia secondary to blood loss (chronic): Secondary | ICD-10-CM | POA: Diagnosis not present

## 2021-07-08 DIAGNOSIS — R2689 Other abnormalities of gait and mobility: Secondary | ICD-10-CM | POA: Diagnosis not present

## 2021-07-08 DIAGNOSIS — S8392XA Sprain of unspecified site of left knee, initial encounter: Secondary | ICD-10-CM | POA: Diagnosis not present

## 2021-07-08 DIAGNOSIS — M2550 Pain in unspecified joint: Secondary | ICD-10-CM | POA: Diagnosis not present

## 2021-07-08 DIAGNOSIS — M79651 Pain in right thigh: Secondary | ICD-10-CM | POA: Diagnosis not present

## 2021-07-08 DIAGNOSIS — M791 Myalgia, unspecified site: Secondary | ICD-10-CM | POA: Diagnosis not present

## 2021-07-08 DIAGNOSIS — M25461 Effusion, right knee: Secondary | ICD-10-CM | POA: Diagnosis not present
